# Patient Record
Sex: Male | Born: 1975 | Race: White | Hispanic: No | Marital: Single | Smoking: Former smoker
Health system: Southern US, Community
[De-identification: ages and names within clinical notes are randomized; demographics above are authoritative.]

## PROBLEM LIST (undated history)

## (undated) DIAGNOSIS — K219 Gastro-esophageal reflux disease without esophagitis: Secondary | ICD-10-CM

## (undated) DIAGNOSIS — G479 Sleep disorder, unspecified: Secondary | ICD-10-CM

## (undated) DIAGNOSIS — F319 Bipolar disorder, unspecified: Secondary | ICD-10-CM

## (undated) DIAGNOSIS — F909 Attention-deficit hyperactivity disorder, unspecified type: Secondary | ICD-10-CM

## (undated) DIAGNOSIS — E785 Hyperlipidemia, unspecified: Secondary | ICD-10-CM

## (undated) DIAGNOSIS — F4310 Post-traumatic stress disorder, unspecified: Secondary | ICD-10-CM

## (undated) DIAGNOSIS — M1990 Unspecified osteoarthritis, unspecified site: Secondary | ICD-10-CM

## (undated) DIAGNOSIS — M797 Fibromyalgia: Secondary | ICD-10-CM

## (undated) DIAGNOSIS — F209 Schizophrenia, unspecified: Secondary | ICD-10-CM

## (undated) DIAGNOSIS — Z87442 Personal history of urinary calculi: Secondary | ICD-10-CM

## (undated) DIAGNOSIS — N529 Male erectile dysfunction, unspecified: Secondary | ICD-10-CM

## (undated) DIAGNOSIS — I10 Essential (primary) hypertension: Secondary | ICD-10-CM

## (undated) DIAGNOSIS — J189 Pneumonia, unspecified organism: Secondary | ICD-10-CM

## (undated) DIAGNOSIS — G709 Myoneural disorder, unspecified: Secondary | ICD-10-CM

## (undated) DIAGNOSIS — J302 Other seasonal allergic rhinitis: Secondary | ICD-10-CM

## (undated) DIAGNOSIS — I2510 Atherosclerotic heart disease of native coronary artery without angina pectoris: Secondary | ICD-10-CM

## (undated) DIAGNOSIS — F32A Depression, unspecified: Secondary | ICD-10-CM

## (undated) HISTORY — PX: OTHER SURGICAL HISTORY: SHX169

## (undated) HISTORY — PX: REVISION AMPUTATION, BELOW THE KNEE: SHX7423

## (undated) HISTORY — PX: HAND SURGERY: SHX662

## (undated) HISTORY — PX: KNEE SURGERY: SHX244

---

## 1996-11-19 HISTORY — PX: KNEE SURGERY: SHX244

## 2002-01-15 ENCOUNTER — Emergency Department (HOSPITAL_COMMUNITY): Admission: EM | Admit: 2002-01-15 | Discharge: 2002-01-15 | Payer: Self-pay | Admitting: Emergency Medicine

## 2004-05-03 ENCOUNTER — Emergency Department (HOSPITAL_COMMUNITY): Admission: EM | Admit: 2004-05-03 | Discharge: 2004-05-04 | Payer: Self-pay

## 2010-01-31 ENCOUNTER — Emergency Department (HOSPITAL_COMMUNITY): Admission: EM | Admit: 2010-01-31 | Discharge: 2010-01-31 | Payer: Self-pay | Admitting: Emergency Medicine

## 2010-02-14 ENCOUNTER — Emergency Department (HOSPITAL_COMMUNITY): Admission: EM | Admit: 2010-02-14 | Discharge: 2010-02-14 | Payer: Self-pay | Admitting: Emergency Medicine

## 2010-09-11 ENCOUNTER — Emergency Department (HOSPITAL_COMMUNITY)
Admission: EM | Admit: 2010-09-11 | Discharge: 2010-09-11 | Payer: Self-pay | Source: Home / Self Care | Admitting: Emergency Medicine

## 2011-01-06 ENCOUNTER — Emergency Department (HOSPITAL_COMMUNITY)
Admission: EM | Admit: 2011-01-06 | Discharge: 2011-01-06 | Disposition: A | Discharge status: Home / Self Care | Payer: Self-pay | Attending: Emergency Medicine | Admitting: Emergency Medicine

## 2011-01-06 DIAGNOSIS — E119 Type 2 diabetes mellitus without complications: Secondary | ICD-10-CM | POA: Insufficient documentation

## 2011-01-06 DIAGNOSIS — T391X1A Poisoning by 4-Aminophenol derivatives, accidental (unintentional), initial encounter: Secondary | ICD-10-CM | POA: Insufficient documentation

## 2011-01-06 DIAGNOSIS — F3289 Other specified depressive episodes: Secondary | ICD-10-CM | POA: Insufficient documentation

## 2011-01-06 DIAGNOSIS — Z79899 Other long term (current) drug therapy: Secondary | ICD-10-CM | POA: Insufficient documentation

## 2011-01-06 DIAGNOSIS — R000 Tachycardia, unspecified: Secondary | ICD-10-CM | POA: Insufficient documentation

## 2011-01-06 DIAGNOSIS — F329 Major depressive disorder, single episode, unspecified: Secondary | ICD-10-CM | POA: Insufficient documentation

## 2011-01-06 DIAGNOSIS — F209 Schizophrenia, unspecified: Secondary | ICD-10-CM | POA: Insufficient documentation

## 2011-01-06 LAB — CBC
HCT: 48.9 % (ref 39.0–52.0)
MCHC: 34.2 g/dL (ref 30.0–36.0)
MCV: 90.7 fL (ref 78.0–100.0)
RDW: 13.2 % (ref 11.5–15.5)

## 2011-01-06 LAB — COMPREHENSIVE METABOLIC PANEL
Alkaline Phosphatase: 61 U/L (ref 39–117)
BUN: 7 mg/dL (ref 6–23)
Calcium: 9.7 mg/dL (ref 8.4–10.5)
Glucose, Bld: 200 mg/dL — ABNORMAL HIGH (ref 70–99)
Total Protein: 7.8 g/dL (ref 6.0–8.3)

## 2011-01-06 LAB — RAPID URINE DRUG SCREEN, HOSP PERFORMED
Amphetamines: NOT DETECTED
Cocaine: NOT DETECTED
Opiates: NOT DETECTED
Tetrahydrocannabinol: POSITIVE — AB

## 2011-01-06 LAB — PROTIME-INR: INR: 0.95 (ref 0.00–1.49)

## 2011-01-06 LAB — DIFFERENTIAL
Basophils Absolute: 0.1 10*3/uL (ref 0.0–0.1)
Eosinophils Relative: 5 % (ref 0–5)
Lymphocytes Relative: 26 % (ref 12–46)
Monocytes Absolute: 0.9 10*3/uL (ref 0.1–1.0)

## 2011-01-06 LAB — URINALYSIS, ROUTINE W REFLEX MICROSCOPIC
Bilirubin Urine: NEGATIVE
Hgb urine dipstick: NEGATIVE
Specific Gravity, Urine: 1.011 (ref 1.005–1.030)
pH: 6.5 (ref 5.0–8.0)

## 2011-01-06 LAB — SALICYLATE LEVEL: Salicylate Lvl: <4 mg/dL (ref 2.8–20.0)

## 2011-01-06 LAB — ACETAMINOPHEN LEVEL: Acetaminophen (Tylenol), Serum: 15.7 ug/mL (ref 10–30)

## 2011-01-25 NOTE — H&P (Signed)
NAME:  Mitchell George, Mitchell George NO.:  [id number]  MEDICAL RECORD NO.:  [id number]           PATIENT TYPE:  E  LOCATION:  WLED                         FACILITY:  [facility]  PHYSICIAN:  Lonia Blood, M.D.      DATE OF BIRTH:  01/20/1976  DATE OF ADMISSION:  01/06/2011 DATE OF DISCHARGE:                             HISTORY & PHYSICAL   PRIMARY CARE PHYSICIAN:  He is unassigned to [place].  PRESENTING COMPLAINT:  Tylox overdose.  HISTORY OF PRESENT ILLNESS:  The patient is a 35 year old gentleman with known history of bipolar disorder and schizophrenia, who apparently took about 19 tablets of Tylox 5/500 mg.  He did because the patient was angry and not happy with his girlfriend.  He also complained of left shoulder pain, that was excessive.  He currently denied any suicide ideation or suicide attempt.  He denied any previous attempts, although he is known to have bipolar disorder.  He was upset with his girlfriend apparently who will not answer the phone at that time.  In the ED, he was seen about 2 hours after the ingestion and his initial Tylenol level was only slightly elevated at 36.  The patient is currently refusing to stay in the hospital.  He denied any previous suicide attempts.  He is a known diabetic, but very noncompliant, not taking any medications consistently.  He has been taking metformin on and off otherwise. Denied any dizziness.  Denied any headaches.  No any evidence of bleeds.  PAST MEDICAL HISTORY:  Significant for, 1. Schizophrenia. 2. Bipolar disorder. 3. Type 2 diabetes. 4. Also history of seasonal allergies and polysubstance abuse.  ALLERGIES:  He has no known drug allergies, although he has BEES and POLLENS allergies.  CURRENT MEDICATIONS:  Trazodone, Vistaril, Tylox, and occasional metformin.  SOCIAL HISTORY:  The patient lives in [place], [place].  He is unemployed.  He smokes about 1 pack per day, occasionally drinks, also uses cannabis.   Denied any IV drug use.  FAMILY HISTORY:  Significant for MI in his family.  REVIEW OF SYSTEMS:  The patient complained of left shoulder pain. Otherwise, all systems reviewed are currently negative except per HPI.  PHYSICAL EXAMINATION:  VITAL SIGNS:  Temperature is 98.1, blood pressure 140/86 with a pulse of 71, respiratory rate 18, he is saturating 100% room air. GENERAL:  He is awake, alert, oriented, very combative, angry, pacing around the room.  He is in no acute distress. HEENT:  PERRL.  EOMI.  No pallor.  No jaundice.  No rhinorrhea. NECK:  Supple.  No visible JVD.  No lymphadenopathy. RESPIRATORY:  He has good air entry bilaterally.  No crackles.  No wheezes.  No rales. CARDIOVASCULAR SYSTEM:  He has S1 and S2.  No audible murmur. ABDOMEN:  Soft, full, nontender with positive bowel sounds. EXTREMITIES:  Showed no edema, cyanosis, or clubbing. SKIN:  No rashes.  No ulcers.  LABORATORY DATA:  His white count is 11.3 with normal differentials, hemoglobin 16.7, platelets 212.  PT is 12.9 with INR of 0.95, PTT of 36. Urinalysis shows mild glucosuria of 100.  Urine  drug screen is only positive for THC.  His initial acetaminophen level is 36.9, that is 2 hours post ingestion.  Sodium is 137, potassium 4.1, chloride 100, CO2 of 27, glucose 200, BUN 7, creatinine 0.75, total bilirubin 0.6, alkaline phosphatase 61, AST 22, ALT 20, total protein 7.8, albumin 4.5, calcium 9.7.  He salicylate level is less than 4.  ASSESSMENT:  This is a 35 year old gentleman presenting with Tylenol overdose.  The patient denied any suicidal ideation.  However, it appears he took these after he was angry with his girlfriend and everything points towards an attempted suicide at this point.  PLAN: 1. Tylenol overdose.  We will admit the patient.  Repeat his Tylenol     level at 6 hours interval 2 more times.  The patient has already     received Mucomyst in the ED as well as charcoal.  His LFTs are  not     elevated at this point.  We will, however, admit him for     observation to check the serial Tylenol levels as well as his LFTs     and make sure they will be returned to normal or at least there are     not elevated.  Once we clear him from our standpoint, we will let     Psych decide what to do. 2. Bipolar disorder and schizophrenia.  Again, the patient is taking     his medication.  I will continue the trazodone and we will get     psych consult in the morning for further management.  If the     patient insists on leaving at this point, we will involuntarily     commit him as the patient is definitely not a danger to himself. 3. Tobacco abuse.  I will give him some nicotine patch as well as     tobacco cessation counseling . 4. Polysubstance abuse.  Again, the patient will receive counseling     while in the hospital.  If Psychiatry decides to send him to     inpatient psych, again he will continue to get care over there.     Further treatment will depend on the patient's response to these     measures and also depend on what psychiatric says.     Lonia Blood, M.D.     Verlin Grills  D:  01/06/2011  T:  01/06/2011  Job:  [id number]  Electronically Signed by Lonia Blood M.D. on 01/24/2011 04:20:25 PM

## 2011-02-11 LAB — GLUCOSE, CAPILLARY: Glucose-Capillary: 324 mg/dL — ABNORMAL HIGH (ref 70–99)

## 2012-02-26 ENCOUNTER — Emergency Department (HOSPITAL_COMMUNITY): Payer: Self-pay

## 2012-02-26 ENCOUNTER — Encounter (HOSPITAL_COMMUNITY): Payer: Self-pay | Admitting: *Deleted

## 2012-02-26 ENCOUNTER — Emergency Department (HOSPITAL_COMMUNITY)
Admission: EM | Admit: 2012-02-26 | Discharge: 2012-02-26 | Disposition: A | Discharge status: Home / Self Care | Payer: Self-pay | Attending: Emergency Medicine | Admitting: Emergency Medicine

## 2012-02-26 DIAGNOSIS — IMO0002 Reserved for concepts with insufficient information to code with codable children: Secondary | ICD-10-CM | POA: Insufficient documentation

## 2012-02-26 DIAGNOSIS — M79609 Pain in unspecified limb: Secondary | ICD-10-CM | POA: Insufficient documentation

## 2012-02-26 DIAGNOSIS — M25559 Pain in unspecified hip: Secondary | ICD-10-CM | POA: Insufficient documentation

## 2012-02-26 DIAGNOSIS — F319 Bipolar disorder, unspecified: Secondary | ICD-10-CM | POA: Insufficient documentation

## 2012-02-26 DIAGNOSIS — Z79899 Other long term (current) drug therapy: Secondary | ICD-10-CM | POA: Insufficient documentation

## 2012-02-26 DIAGNOSIS — S43409A Unspecified sprain of unspecified shoulder joint, initial encounter: Secondary | ICD-10-CM

## 2012-02-26 DIAGNOSIS — Z8659 Personal history of other mental and behavioral disorders: Secondary | ICD-10-CM | POA: Insufficient documentation

## 2012-02-26 DIAGNOSIS — M25519 Pain in unspecified shoulder: Secondary | ICD-10-CM | POA: Insufficient documentation

## 2012-02-26 DIAGNOSIS — F17200 Nicotine dependence, unspecified, uncomplicated: Secondary | ICD-10-CM | POA: Insufficient documentation

## 2012-02-26 DIAGNOSIS — E119 Type 2 diabetes mellitus without complications: Secondary | ICD-10-CM | POA: Insufficient documentation

## 2012-02-26 DIAGNOSIS — W108XXA Fall (on) (from) other stairs and steps, initial encounter: Secondary | ICD-10-CM | POA: Insufficient documentation

## 2012-02-26 HISTORY — DX: Schizophrenia, unspecified: F20.9

## 2012-02-26 HISTORY — DX: Bipolar disorder, unspecified: F31.9

## 2012-02-26 MED ORDER — IBUPROFEN 800 MG PO TABS: 800.0000 mg | ORAL_TABLET | Freq: Three times a day (TID) | ORAL | Status: AC

## 2012-02-26 MED ORDER — OXYCODONE-ACETAMINOPHEN 5-325 MG PO TABS
1.0000 | ORAL_TABLET | Freq: Once | ORAL | Status: AC
  Administered 2012-02-26: 1 via ORAL
  Filled 2012-02-26: qty 1

## 2012-02-26 MED ORDER — IBUPROFEN 800 MG PO TABS
800.0000 mg | ORAL_TABLET | Freq: Once | ORAL | Status: AC
  Administered 2012-02-26: 800 mg via ORAL
  Filled 2012-02-26: qty 1

## 2012-02-26 MED ORDER — ACETAMINOPHEN-CODEINE #3 300-30 MG PO TABS: 1.0000 | ORAL_TABLET | Freq: Four times a day (QID) | ORAL | Status: AC | PRN

## 2012-02-26 NOTE — Discharge Instructions (Signed)
Take ibuprofen as directed for inflammation and pain with Tylenol #3 for breakthrough pain but do not drive or operate machinery with tylenol #3 use. Ice to areas of soreness for the next few days and then may move to heat. Expect to be sore for the next few day and follow up with your primary care physician for recheck of ongoing symptoms but return to ER for emergent changing or worsening of symptoms.    Hip Injury You have a been diagnosed with a hip injury. Falls can cause fractures to the pelvis and hip as well as very painful bruises. These are called 'hip pointers'. Muscle injuries, arthritis, sciatica, and bursitis and can also cause severe pelvic or hip pain. An x-ray exam will usually show a fractured bone, but sometimes other studies like a CT scan or MRI may be needed to be certain about the diagnosis. All painful hip injuries should be treated like there might be a fracture until they are better or determined not to be fractures. Rest in bed over the next 3-4 days, or as long as you have severe pain. You should not bear weight on your injured hip. Use crutches or a walker. Ice packs can be applied to the injury site for 20 minutes every 2-4 hours for several days. Pain medicine may be needed to help you rest, especially at night.  A follow-up exam and further studies to determine the cause of your pain are important if your symptoms do not improve rapidly over the next week.  SEEK IMMEDIATE MEDICAL CARE IF:  You re-injure your hip.   You develop more pain, a fever, inability to walk, or other problems.  MAKE SURE YOU:   Understand these instructions.   Will watch your condition.   Will get help right away if you are not doing well or get worse.  Document Released: 12/13/2004 Document Revised: 10/25/2011 Document Reviewed: 02/24/2009 [facility] Patient Information 2012 [place], [place].

## 2012-02-26 NOTE — ED Provider Notes (Signed)
History     CSN: [id number]  Arrival date & time 02/26/12  1614   First MD Initiated Contact with Patient 02/26/12 2219      Chief Complaint  Patient presents with  . Fall    (Consider location/radiation/quality/duration/timing/severity/associated sxs/prior treatment) HPI  Patient presents to the emergency department complaining of left shoulder and hip injury after he states he was walking off of his porch prior to arrival and tripped causing him to fall from the steps onto the ground stating he fell approximately 4 and a half feet. Patient states he landed on his left shoulder and his hip. Patient is complaining of left shoulder and hip pain. Patient denies hitting his head or loss of consciousness. Patient states he took 2 extra strength Tylenol prior to arrival with mild relief of pain. Patient states pain and hip and shoulder aggravated by movement and improved with sitting still. Patient states is followed by primary care physician's office in [place] but is unsure of the name. Patient states his diabetes management and psychiatric needs are taken care of at primary care provider's office. Patient denies extremity numbness/tingling/weakness, chest pain, abdominal pain, shortness of breath, extremity deformity, or difficulty ambulating. Patient states mild pain in hip with ambulating.  Past Medical History  Diagnosis Date  . Diabetes mellitus   . Bipolar 1 disorder   . Schizophrenia     Past Surgical History  Procedure Date  . Knee surgery   . Hand surgery     No family history on file.  History  Substance Use Topics  . Smoking status: Current Everyday Smoker  . Smokeless tobacco: Not on file  . Alcohol Use: Yes      Review of Systems  All other systems reviewed and are negative.    Allergies  Review of patient's allergies indicates no known allergies.  Home Medications   Current Outpatient Rx  Name Route Sig Dispense Refill  . BUPROPION HCL ER (SR) 150  MG PO TB12 Oral Take 150 mg by mouth daily.    Marland Kitchen CITALOPRAM HYDROBROMIDE 40 MG PO TABS Oral Take 40 mg by mouth daily.    Marland Kitchen GABAPENTIN 300 MG PO CAPS Oral Take 300 mg by mouth 3 (three) times daily.    Marland Kitchen GLIPIZIDE 5 MG PO TABS Oral Take 5 mg by mouth 2 (two) times daily before a meal.    . HYDROXYZINE PAMOATE 50 MG PO CAPS Oral Take 100 mg by mouth 4 (four) times daily as needed. As anxiety    . LITHIUM CARBONATE 300 MG PO CAPS Oral Take 300 mg by mouth 2 (two) times daily.    Marland Kitchen RANITIDINE HCL 150 MG PO TABS Oral Take 150 mg by mouth 2 (two) times daily.    Marland Kitchen SIMVASTATIN 20 MG PO TABS Oral Take 20 mg by mouth every evening.    . ACETAMINOPHEN-CODEINE #3 300-30 MG PO TABS Oral Take 1-2 tablets by mouth every 6 (six) hours as needed for pain. 15 tablet 0  . IBUPROFEN 800 MG PO TABS Oral Take 1 tablet (800 mg total) by mouth 3 (three) times daily. 15 tablet 0    BP 142/87  Pulse 68  Temp(Src) 97.7 F (36.5 C) (Oral)  Resp 20  Ht 6\' 1"  (1.854 m)  Wt 246 lb (111.585 kg)  BMI 32.46 kg/m2  SpO2 97%  Physical Exam  Nursing note and vitals reviewed. Constitutional: He is oriented to person, place, and time. He appears well-developed and well-nourished. No distress.  HENT:  Head: Normocephalic and atraumatic.  Eyes: Conjunctivae and EOM are normal. Pupils are equal, round, and reactive to light.  Neck: Normal range of motion. Neck supple.  Cardiovascular: Normal rate, regular rhythm, normal heart sounds and intact distal pulses.  Exam reveals no gallop and no friction rub.   No murmur heard. Pulmonary/Chest: Effort normal and breath sounds normal. No respiratory distress. He has no wheezes. He has no rales. He exhibits no tenderness.  Abdominal: Soft. Bowel sounds are normal. He exhibits no distension and no mass. There is no tenderness. There is no rebound and no guarding.  Musculoskeletal: Normal range of motion. He exhibits tenderness. He exhibits no edema.       FROM of bilateral UE adn  LUE with mild pain with FROM of left arm and left hip but no crepitous and 5/5 strength with movement. Good radial pulse and femoral pulse bialterally. No TTP of entire mid line spine.   Neurological: He is alert and oriented to person, place, and time.  Skin: Skin is warm and dry. No rash noted. He is not diaphoretic. No erythema.  Psychiatric: He has a normal mood and affect.    ED Course  Procedures (including critical care time)  PO percocet and ibuprofen.   Labs Reviewed - No data to display Dg Hip Complete Left  02/26/2012  *RADIOLOGY REPORT*  Clinical Data: Trauma, fall  LEFT HIP - COMPLETE 2+ VIEW  Comparison: Plain films 09/11/2010  Findings: Hips are located.  Dedicated views of the left hip demonstrates no fracture dislocation.  There is joint space narrowing and mild sclerosis of the acetabular roofs.  No change from prior. No evidence of pelvic fracture sacral fracture.  IMPRESSION: No acute findings in the pelvis.  No left hip fracture.  Original Report Authenticated By: Genevive Bi, M.D.   Dg Shoulder Left  02/26/2012  *RADIOLOGY REPORT*  Clinical Data: Fall from horse onto the left side.  LEFT SHOULDER - 2+ VIEW  Comparison: Shoulder films 09/11/2010  Findings: No evidence of acute fracture or dislocation of the left shoulder.  IMPRESSION: No acute fracture dislocation.  No change from prior.  Original Report Authenticated By: Genevive Bi, M.D.     1. Hip pain   2. Shoulder sprain       MDM  5 out of 5 strength of bilateral upper trimming these and lower extremities with mild pain in left shoulder and left hip no acute findings on x-ray. Patient ambulating without difficulty. No midline spinal tenderness to palpation and no signs or symptoms of cauda equina or central cord compression. Patient denies hitting his head or loss of consciousness. His abdomen is soft and nontender. No complaints of chest pain in chest wall is nontender. Upper extremities and lower  extremities are neurovascularly intact with normal pulses bilaterally. Normal sensation of entire upper and lower extremities.        Jenness Corner, [place] 02/26/12 2231

## 2012-02-26 NOTE — ED Notes (Signed)
Patient able to ambulate without difficulty.

## 2012-02-26 NOTE — ED Notes (Signed)
Patient given discharge paperwork; went over discharge instructions with patient.  Instructed patient to take Tylenol #3 and ibuprofen as directed, and to not drive while taking pain medication.  Instructed to follow up with primary care physician and to return to the ED for new, worsening, or concerning symptoms.

## 2012-02-26 NOTE — ED Notes (Signed)
Patient sates he fell off his porch on yesterday,  approx 4.5 feet.  He has pain in his shoulder, left hip, and left leg.  Patient denies loc.  He has increased pain with any movement

## 2012-02-26 NOTE — ED Notes (Signed)
Patient states that he fell off a porch while walking today (fell about four and a half feet) and fell on the left side of his body; patient complaining of left sided shoulder pain and left hip pain.  Patient rates pain 7/10 on the numerical pain scale; describes pain as "sore" and "aching".  Patient able to move all extremities without difficultly.  Patient does report a tingling sensation down left leg.  Patient seems upset about wait; informed patient that x-ray results are already in and that PA will be at bedside to talk about results.  Patient alert and oriented x4; PERRL present.  Will continue to monitor.

## 2012-02-27 NOTE — ED Provider Notes (Signed)
Medical screening examination/treatment/procedure(s) were performed by non-physician practitioner and as supervising physician I was immediately available for consultation/collaboration. Devoria Albe, MD, Armando Gang   Ward Givens, MD 02/27/12 [phone]

## 2013-06-10 ENCOUNTER — Encounter (HOSPITAL_COMMUNITY): Payer: Self-pay | Admitting: *Deleted

## 2013-06-10 ENCOUNTER — Emergency Department (HOSPITAL_COMMUNITY)
Admission: EM | Admit: 2013-06-10 | Discharge: 2013-06-10 | Disposition: A | Discharge status: Home / Self Care | Payer: Medicaid Other | Attending: Emergency Medicine | Admitting: Emergency Medicine

## 2013-06-10 ENCOUNTER — Emergency Department (HOSPITAL_COMMUNITY): Payer: Medicaid Other

## 2013-06-10 DIAGNOSIS — IMO0002 Reserved for concepts with insufficient information to code with codable children: Secondary | ICD-10-CM | POA: Insufficient documentation

## 2013-06-10 DIAGNOSIS — Y939 Activity, unspecified: Secondary | ICD-10-CM | POA: Insufficient documentation

## 2013-06-10 DIAGNOSIS — Y929 Unspecified place or not applicable: Secondary | ICD-10-CM | POA: Insufficient documentation

## 2013-06-10 DIAGNOSIS — M5417 Radiculopathy, lumbosacral region: Secondary | ICD-10-CM

## 2013-06-10 DIAGNOSIS — Z8659 Personal history of other mental and behavioral disorders: Secondary | ICD-10-CM | POA: Insufficient documentation

## 2013-06-10 DIAGNOSIS — E119 Type 2 diabetes mellitus without complications: Secondary | ICD-10-CM | POA: Insufficient documentation

## 2013-06-10 DIAGNOSIS — F319 Bipolar disorder, unspecified: Secondary | ICD-10-CM | POA: Insufficient documentation

## 2013-06-10 DIAGNOSIS — W108XXA Fall (on) (from) other stairs and steps, initial encounter: Secondary | ICD-10-CM | POA: Insufficient documentation

## 2013-06-10 DIAGNOSIS — F17200 Nicotine dependence, unspecified, uncomplicated: Secondary | ICD-10-CM | POA: Insufficient documentation

## 2013-06-10 DIAGNOSIS — Z91040 Latex allergy status: Secondary | ICD-10-CM | POA: Insufficient documentation

## 2013-06-10 MED ORDER — HYDROCODONE-ACETAMINOPHEN 5-325 MG PO TABS: 1.0000 | ORAL_TABLET | Freq: Four times a day (QID) | ORAL | Status: DC | PRN

## 2013-06-10 MED ORDER — OXYCODONE-ACETAMINOPHEN 5-325 MG PO TABS
1.0000 | ORAL_TABLET | Freq: Once | ORAL | Status: AC
  Administered 2013-06-10: 1 via ORAL
  Filled 2013-06-10: qty 1

## 2013-06-10 MED ORDER — NAPROXEN 375 MG PO TABS: 375.0000 mg | ORAL_TABLET | Freq: Two times a day (BID) | ORAL | Status: DC

## 2013-06-10 NOTE — ED Provider Notes (Signed)
History    CSN: [id number] Arrival date & time 06/10/13  1318  First MD Initiated Contact with Patient 06/10/13 1435     Chief Complaint  Patient presents with  . Fall  . Back Pain   (Consider location/radiation/quality/duration/timing/severity/associated sxs/prior Treatment) HPI Mitchell George is a 37 y.o. male who presents to ED with complaint of blower back pain. States a week ago he fell down few steps. States landed on lower back. Since then pain in the back radiating down bilateral thighs. States decreased sensation in bilateral thighs. Denies loss of urine or bowels. Denies weakness in extremities. No abdominal pain. No fever chills. No iv drug use. Did not take anything for pain.    Past Medical History  Diagnosis Date  . Diabetes mellitus   . Bipolar 1 disorder   . Schizophrenia    Past Surgical History  Procedure Laterality Date  . Knee surgery    . Hand surgery     No family history on file. History  Substance Use Topics  . Smoking status: Current Every Day Smoker  . Smokeless tobacco: Never Used  . Alcohol Use: Yes    Review of Systems  Constitutional: Negative for fever and chills.  HENT: Negative for neck pain.   Respiratory: Negative.   Cardiovascular: Negative.   Genitourinary: Negative for frequency and flank pain.  Musculoskeletal: Positive for back pain and arthralgias.  Skin: Negative.   Neurological: Positive for numbness. Negative for weakness.    Allergies  Latex  Home Medications   Current Outpatient Rx  Name  Route  Sig  Dispense  Refill  . gabapentin (NEURONTIN) 600 MG tablet   Oral   Take 600 mg by mouth 2 (two) times daily.         Marland Kitchen glipiZIDE (GLUCOTROL) 10 MG tablet   Oral   Take 10 mg by mouth 2 (two) times daily before a meal.         . hydrOXYzine (VISTARIL) 50 MG capsule   Oral   Take 100 mg by mouth 4 (four) times daily as needed for anxiety.          . metFORMIN (GLUCOPHAGE) 500 MG tablet   Oral   Take 500  mg by mouth.         . ranitidine (ZANTAC) 150 MG tablet   Oral   Take 150 mg by mouth 2 (two) times daily.         Marland Kitchen buPROPion (WELLBUTRIN SR) 150 MG 12 hr tablet   Oral   Take 150 mg by mouth daily.         . citalopram (CELEXA) 40 MG tablet   Oral   Take 40 mg by mouth daily.         Marland Kitchen HYDROcodone-acetaminophen (NORCO) 5-325 MG per tablet   Oral   Take 1 tablet by mouth every 6 (six) hours as needed.   20 tablet   0   . lithium carbonate 300 MG capsule   Oral   Take 300 mg by mouth 2 (two) times daily.         . naproxen (NAPROSYN) 375 MG tablet   Oral   Take 1 tablet (375 mg total) by mouth 2 (two) times daily.   20 tablet   0   . simvastatin (ZOCOR) 20 MG tablet   Oral   Take 20 mg by mouth every evening.          BP 129/71  Pulse 89  Temp(Src) 98.8 F (37.1 C) (Oral)  Resp 18  Ht 6\' 2"  (1.88 m)  Wt 202 lb (91.627 kg)  BMI 25.92 kg/m2  SpO2 99% Physical Exam  Nursing note and vitals reviewed. Constitutional: He appears well-developed and well-nourished. No distress.  Neck: Neck supple.  Cardiovascular: Normal rate, regular rhythm and normal heart sounds.   Pulmonary/Chest: Effort normal and breath sounds normal. No respiratory distress. He has no wheezes. He has no rales.  Musculoskeletal:  Midline and left SI joint tenderness. Pain with bilateral straight legs  Raise. DP pulses normal  Neurological:  5/5 and equal lower extremity strength. 2+ and equal patellar reflexes bilaterally. Pt able to dorsiflex bilateral toes and feet with good strength against resistance. Equal sensation bilaterally over thighs and lower legs.   Skin: Skin is warm and dry.    ED Course  Procedures (including critical care time) Labs Reviewed - No data to display Dg Lumbar Spine Complete  06/10/2013   *RADIOLOGY REPORT*  Clinical Data: Increasing low back pain extending into both legs. The patient fell last week.  LUMBAR SPINE - COMPLETE 4+ VIEW  Comparison:  None.  Findings: There are five lumbar type vertebral bodies.  The alignment is normal.  There is no evidence of acute fracture or pars defect.  Although the disc spaces are preserved, there is mild intervertebral spurring throughout the spine and mild facet disease inferiorly.  IMPRESSION: No acute osseous findings or malalignment.   Original Report Authenticated By: Carey Bullocks, M.D.   1. Lumbosacral radiculopathy     MDM  Pt with lower back pain, injury 1 wk ago. Pt neurovascularly intact. Normal reflexes. Normal sensation. No loss of bowels or urinary incontinence/retention. No recent falls or injuries. Pt ambulatory. Afebrile. No signs of cauda equina.  Will treat with ibuprofen, norco, follow up as needed.   Filed Vitals:   06/10/13 1335  BP: 129/71  Pulse: 89  Temp: 98.8 F (37.1 C)  TempSrc: Oral  Resp: 18  Height: 6\' 2"  (1.88 m)  Weight: 202 lb (91.627 kg)  SpO2: 99%     Reinaldo Helt A Lynnann Knudsen, PA-C 06/10/13 1626

## 2013-06-10 NOTE — ED Notes (Signed)
Pt states last Tuesday he fell down 9 stairs, states pain hasn't gotten any better, pt states having lower back pain radiating to thighs, states having some numbness in thighs also.

## 2013-06-10 NOTE — Progress Notes (Signed)
ED Cm noted pt with medicaid [place]. Pt confirmed pcp will be Dr Aida Puffer of [place] in "fifteen days" after being placed on he medicaid card Reports he visited DSS on 06/08/13 to have MD name entered as his pcp EPIC updated

## 2013-06-11 NOTE — ED Provider Notes (Signed)
Medical screening examination/treatment/procedure(s) were performed by non-physician practitioner and as supervising physician I was immediately available for consultation/collaboration.   Veronnica Hennings M Lucyann Romano, MD 06/11/13 1321 

## 2013-09-08 ENCOUNTER — Other Ambulatory Visit: Payer: Self-pay | Admitting: Family Medicine

## 2013-09-08 DIAGNOSIS — IMO0002 Reserved for concepts with insufficient information to code with codable children: Secondary | ICD-10-CM

## 2013-09-13 ENCOUNTER — Ambulatory Visit
Admission: RE | Admit: 2013-09-13 | Discharge: 2013-09-13 | Disposition: A | Discharge status: Home / Self Care | Payer: Medicaid Other | Source: Ambulatory Visit | Attending: Family Medicine | Admitting: Family Medicine

## 2013-09-13 DIAGNOSIS — IMO0002 Reserved for concepts with insufficient information to code with codable children: Secondary | ICD-10-CM

## 2013-09-21 ENCOUNTER — Other Ambulatory Visit: Payer: Self-pay | Admitting: Family Medicine

## 2013-09-21 DIAGNOSIS — M545 Low back pain, unspecified: Secondary | ICD-10-CM

## 2013-09-24 ENCOUNTER — Ambulatory Visit
Admission: RE | Admit: 2013-09-24 | Discharge: 2013-09-24 | Disposition: A | Discharge status: Home / Self Care | Payer: Medicaid Other | Source: Ambulatory Visit | Attending: Family Medicine | Admitting: Family Medicine

## 2013-09-24 DIAGNOSIS — M545 Low back pain: Secondary | ICD-10-CM

## 2013-09-25 ENCOUNTER — Ambulatory Visit
Admission: RE | Admit: 2013-09-25 | Discharge: 2013-09-25 | Disposition: A | Discharge status: Home / Self Care | Payer: Medicaid Other | Source: Ambulatory Visit | Attending: Family Medicine | Admitting: Family Medicine

## 2013-09-25 ENCOUNTER — Other Ambulatory Visit: Payer: Self-pay | Admitting: Family Medicine

## 2013-09-25 DIAGNOSIS — M545 Low back pain: Secondary | ICD-10-CM

## 2013-10-20 ENCOUNTER — Other Ambulatory Visit: Payer: Self-pay | Admitting: Family Medicine

## 2013-10-20 DIAGNOSIS — A5201 Syphilitic aneurysm of aorta: Secondary | ICD-10-CM

## 2013-10-21 ENCOUNTER — Ambulatory Visit
Admission: RE | Admit: 2013-10-21 | Discharge: 2013-10-21 | Disposition: A | Discharge status: Home / Self Care | Payer: Medicaid Other | Source: Ambulatory Visit | Attending: Family Medicine | Admitting: Family Medicine

## 2013-10-21 DIAGNOSIS — A5201 Syphilitic aneurysm of aorta: Secondary | ICD-10-CM

## 2013-10-21 MED ORDER — IOHEXOL 300 MG/ML  SOLN
30.0000 mL | Freq: Once | INTRAMUSCULAR | Status: AC | PRN
  Administered 2013-10-21: 30 mL via ORAL

## 2013-10-21 MED ORDER — IOHEXOL 300 MG/ML  SOLN
100.0000 mL | Freq: Once | INTRAMUSCULAR | Status: AC | PRN
  Administered 2013-10-21: 100 mL via INTRAVENOUS

## 2014-03-30 ENCOUNTER — Ambulatory Visit (INDEPENDENT_AMBULATORY_CARE_PROVIDER_SITE_OTHER): Payer: Medicaid Other | Admitting: Podiatrist

## 2014-03-30 ENCOUNTER — Encounter: Payer: Self-pay | Admitting: Podiatrist

## 2014-03-30 ENCOUNTER — Ambulatory Visit (INDEPENDENT_AMBULATORY_CARE_PROVIDER_SITE_OTHER): Payer: Medicaid Other

## 2014-03-30 VITALS — BP 119/77 | HR 56 | Resp 18

## 2014-03-30 DIAGNOSIS — M722 Plantar fascial fibromatosis: Secondary | ICD-10-CM

## 2014-03-30 DIAGNOSIS — M67979 Unspecified disorder of synovium and tendon, unspecified ankle and foot: Secondary | ICD-10-CM

## 2014-03-30 MED ORDER — TRIAMCINOLONE ACETONIDE 40 MG/ML IJ SUSP
20.0000 mg | Freq: Once | INTRAMUSCULAR | Status: AC
  Administered 2014-03-30: 20 mg

## 2014-03-30 MED ORDER — PREDNISONE 10 MG PO KIT
PACK | ORAL | Status: DC
Start: 2014-03-30 — End: 2014-05-14

## 2014-03-30 NOTE — Patient Instructions (Signed)

## 2014-03-30 NOTE — Progress Notes (Signed)
   Subjective:    Patient ID: Mitchell George, male    DOB: 04-06-1976, 38 y.o.   MRN: [id number]  HPI the back of my heel has been going on for more than a year and sore and tender and gets big by the end of the night and feels like a knife stabbing    Review of Systems  Musculoskeletal: Positive for back pain and gait problem.       Muscle and joint pain  All other systems reviewed and are negative.      Objective:   Physical Exam GENERAL APPEARANCE: Alert, conversant. Appropriately groomed. No acute distress.  VASCULAR: Pedal pulses palpable and strong bilateral.  Capillary refill time is immediate to all digits,  Proximal to distal cooling it warm to warm.  Digital hair growth is present bilateral  NEUROLOGIC: sensation is intact epicritically and protectively to 5.07 monofilament at 5/5 sites bilateral.  Light touch is intact bilateral, vibratory sensation intact bilateral, achilles tendon reflex is intact bilateral.  Negative Tinel sign is elicited MUSCULOSKELETAL: Pain on palpation plantar medial aspect left foot  at insertion of plantar fascia on the medial calcaneal tubercle. Inflammation at the insertion of the plantar fascia is present. Rectus foot type is seen. DERMATOLOGIC: skin color, texture, and turger are within normal limits.  Calluses plantar feet present but asymptomatic    Assessment & Plan:  Plantar  fasciitis left foot  Plan: Injected the left heel with Kenalog and Marcaine mixture under sterile technique and the patient tolerated it well. Dispensed a plantar fascial brace. Dispensed instructions for shoe gear changes and stretching. I will see him back in 3 weeks for followup. At that time we'll decide if an injection was beneficial or not.

## 2014-04-13 ENCOUNTER — Ambulatory Visit (INDEPENDENT_AMBULATORY_CARE_PROVIDER_SITE_OTHER): Payer: Medicaid Other

## 2014-04-13 ENCOUNTER — Ambulatory Visit (INDEPENDENT_AMBULATORY_CARE_PROVIDER_SITE_OTHER): Payer: Medicaid Other | Admitting: Podiatrist

## 2014-04-13 ENCOUNTER — Encounter: Payer: Self-pay | Admitting: Podiatrist

## 2014-04-13 VITALS — BP 139/87 | HR 71 | Resp 18

## 2014-04-13 DIAGNOSIS — M722 Plantar fascial fibromatosis: Secondary | ICD-10-CM

## 2014-04-13 DIAGNOSIS — T148XXA Other injury of unspecified body region, initial encounter: Secondary | ICD-10-CM

## 2014-04-13 MED ORDER — DICLOFENAC SODIUM 75 MG PO TBEC: 75.0000 mg | DELAYED_RELEASE_TABLET | Freq: Two times a day (BID) | ORAL | Status: DC

## 2014-04-13 NOTE — Patient Instructions (Addendum)
Stop taking naprosyn and start the Diclofenac Sodium instead-- it is a good antiinflammatory medication--  Wear a really supportive running style or hiking shoe to keep the feet well supported.  Call if you don't notice any improvement in 2 weeks and we will immobilize the foot in a boot.    Plantar Fasciitis (Heel Spur Syndrome) with Rehab The plantar fascia is a fibrous, ligament-like, soft-tissue structure that spans the bottom of the foot. Plantar fasciitis is a condition that causes pain in the foot due to inflammation of the tissue. SYMPTOMS   Pain and tenderness on the underneath side of the foot.  Pain that worsens with standing or walking. CAUSES  Plantar fasciitis is caused by irritation and injury to the plantar fascia on the underneath side of the foot. Common mechanisms of injury include:  Direct trauma to bottom of the foot.  Damage to a small nerve that runs under the foot where the main fascia attaches to the heel bone. Stress placed on the plantar fascia due to any mild increased activity or injury RISK INCREASES WITH:   Obesity.  Poor strength and flexibility.  Improperly fitted shoes.  Tight calf muscles.  Flat feet.  Failure to warm-up properly before activity.  PREVENTION  Warm up and stretch properly before activity.  Strength, flexibility  Maintain a health body weight.  Avoid stress on the plantar fascia.  Wear properly fitted shoes, including arch supports for individuals who have flat feet. PROGNOSIS  If treated properly, then the symptoms of plantar fasciitis usually resolve without surgery. However, occasionally surgery is necessary. RELATED COMPLICATIONS   Recurrent symptoms that may result in a chronic condition.  Problems of the lower back that are caused by compensating for the injury, such as limping.  Pain or weakness of the foot during push-off following surgery.  Chronic inflammation, scarring, and partial or complete fascia  tear, occurring more often from repeated injections. TREATMENT  Treatment initially involves the use of ice and medication to help reduce pain and inflammation. The use of strengthening and stretching exercises may help reduce pain with activity, especially stretches of the Achilles tendon.  Your caregiver may recommend that you use arch supports to help reduce stress on the plantar fascia. Often, corticosteroid injections are given to reduce inflammation. If symptoms persist for greater than 6 months despite non-surgical (conservative), then surgery may be recommended.  MEDICATION   If pain medication is necessary, then nonsteroidal anti-inflammatory medications, such as aspirin and ibuprofen, or other minor pain relievers, such as acetaminophen, are often recommended. Corticosteroid injections may be given by your caregiver.  HEAT AND COLD  Cold treatment (icing) relieves pain and reduces inflammation. Cold treatment should be applied for 10 to 15 minutes every 2 to 3 hours for inflammation and pain and immediately after any activity that aggravates your symptoms. Use ice packs or massage the area with a piece of ice (ice massage).  Heat treatment may be used prior to performing the stretching and strengthening activities prescribed by your caregiver, physical therapist, or athletic trainer. Use a heat pack or soak the injury in warm water. SEEK IMMEDIATE MEDICAL CARE IF:  Treatment seems to offer no benefit, or the condition worsens.  Any medications produce adverse side effects.    EXERCISES-- perform each exercise a total of 10-15 repetitions.  Hold for 30 seconds and perform 3 times per day   RANGE OF MOTION (ROM) AND STRETCHING EXERCISES - Plantar Fasciitis (Heel Spur Syndrome) These exercises may help you  when beginning to rehabilitate your injury.   While completing these exercises, remember:   Restoring tissue flexibility helps normal motion to return to the joints. This allows  healthier, less painful movement and activity.  An effective stretch should be held for at least 30 seconds.  A stretch should never be painful. You should only feel a gentle lengthening or release in the stretched tissue. RANGE OF MOTION - Toe Extension, Flexion  Sit with your right / left leg crossed over your opposite knee.  Grasp your toes and gently pull them back toward the top of your foot. You should feel a stretch on the bottom of your toes and/or foot.  Hold this stretch for __________ seconds.  Now, gently pull your toes toward the bottom of your foot. You should feel a stretch on the top of your toes and or foot.  Hold this stretch for __________ seconds. Repeat __________ times. Complete this stretch __________ times per day.  RANGE OF MOTION - Ankle Dorsiflexion, Active Assisted  Remove shoes and sit on a chair that is preferably not on a carpeted surface.  Place right / left foot under knee. Extend your opposite leg for support.  Keeping your heel down, slide your right / left foot back toward the chair until you feel a stretch at your ankle or calf. If you do not feel a stretch, slide your bottom forward to the edge of the chair, while still keeping your heel down.  Hold this stretch for __________ seconds. Repeat __________ times. Complete this stretch __________ times per day.  STRETCH  Gastroc, Standing  Place hands on wall.  Extend right / left leg, keeping the front knee somewhat bent.  Slightly point your toes inward on your back foot.  Keeping your right / left heel on the floor and your knee straight, shift your weight toward the wall, not allowing your back to arch.  You should feel a gentle stretch in the right / left calf. Hold this position for __________ seconds. Repeat __________ times. Complete this stretch __________ times per day. STRETCH  Soleus, Standing  Place hands on wall.  Extend right / left leg, keeping the other knee somewhat  bent.  Slightly point your toes inward on your back foot.  Keep your right / left heel on the floor, bend your back knee, and slightly shift your weight over the back leg so that you feel a gentle stretch deep in your back calf.  Hold this position for __________ seconds. Repeat __________ times. Complete this stretch __________ times per day. STRETCH  Gastrocsoleus, Standing  Note: This exercise can place a lot of stress on your foot and ankle. Please complete this exercise only if specifically instructed by your caregiver.   Place the ball of your right / left foot on a step, keeping your other foot firmly on the same step.  Hold on to the wall or a rail for balance.  Slowly lift your other foot, allowing your body weight to press your heel down over the edge of the step.  You should feel a stretch in your right / left calf.  Hold this position for __________ seconds.  Repeat this exercise with a slight bend in your right / left knee. Repeat __________ times. Complete this stretch __________ times per day.  STRENGTHENING EXERCISES - Plantar Fasciitis (Heel Spur Syndrome)  These exercises may help you when beginning to rehabilitate your injury. They may resolve your symptoms with or without further involvement from  your physician, physical therapist or athletic trainer. While completing these exercises, remember:   Muscles can gain both the endurance and the strength needed for everyday activities through controlled exercises.  Complete these exercises as instructed by your physician, physical therapist or athletic trainer. Progress the resistance and repetitions only as guided.

## 2014-04-13 NOTE — Progress Notes (Signed)
The left heel is doing ok and the brace did help and the shot made it feel worse and I notice on my left if I roll it a certain way that is when I feel it and the right foot I stepped on a tree limb and now this foot hurts some and I did it three days ago  Patient relates continued pain in the left heel.  He states the last visit I gave him an injection and the heel hurt for the rest of the day and for 3 days after where he had to use a cane.  He also took the prednisone taper kit which was not helpful either.  He also states he was walking on his property and stepped on a stick straight down on his right heel.  He relates it became bruised and the pain is still there.  Objective: Continued plantar fasciitis symptomatology is elicited plantar medial aspect of the left heel. Pain on the central plantar aspect of the right heel is also noted. X-rays of the right foot show a large inferior calcaneal spur which appears to be fractured in the central portion of the spur.  Assessment: Plantar fasciitis left, fractured inferior heel spur with bone bruise right  Than: I recommended a plantar fascial taping for the left foot. I also recommended a stronger anti-inflammatory medication and diclofenac 75 mg twice a day was written for him. I discussed that I normally would consider another injection however if it hurts him that bad we will not do that today. I also discussed it is not improved within 2 weeks he will call and I will prescribe him a offloading boot for the left foot. However he will be placing more pressure on the right foot where he are he has a deep bone bruise and at this time I don't want to cause more injury to this area. We'll see him back in 3 weeks for followup and for continued care.

## 2014-05-04 ENCOUNTER — Ambulatory Visit: Payer: Medicaid Other | Admitting: Podiatrist

## 2014-05-14 ENCOUNTER — Encounter (HOSPITAL_COMMUNITY): Payer: Self-pay | Admitting: Emergency Medicine

## 2014-05-14 ENCOUNTER — Emergency Department (HOSPITAL_COMMUNITY)
Admission: EM | Admit: 2014-05-14 | Discharge: 2014-05-14 | Disposition: A | Discharge status: Home / Self Care | Payer: Medicaid Other | Attending: Emergency Medicine | Admitting: Emergency Medicine

## 2014-05-14 ENCOUNTER — Emergency Department (HOSPITAL_COMMUNITY): Payer: Medicaid Other

## 2014-05-14 DIAGNOSIS — Y9301 Activity, walking, marching and hiking: Secondary | ICD-10-CM | POA: Insufficient documentation

## 2014-05-14 DIAGNOSIS — F17200 Nicotine dependence, unspecified, uncomplicated: Secondary | ICD-10-CM | POA: Insufficient documentation

## 2014-05-14 DIAGNOSIS — Z91040 Latex allergy status: Secondary | ICD-10-CM | POA: Insufficient documentation

## 2014-05-14 DIAGNOSIS — M79672 Pain in left foot: Secondary | ICD-10-CM

## 2014-05-14 DIAGNOSIS — F209 Schizophrenia, unspecified: Secondary | ICD-10-CM | POA: Insufficient documentation

## 2014-05-14 DIAGNOSIS — F319 Bipolar disorder, unspecified: Secondary | ICD-10-CM | POA: Insufficient documentation

## 2014-05-14 DIAGNOSIS — M791 Myalgia, unspecified site: Secondary | ICD-10-CM

## 2014-05-14 DIAGNOSIS — E119 Type 2 diabetes mellitus without complications: Secondary | ICD-10-CM | POA: Insufficient documentation

## 2014-05-14 DIAGNOSIS — S93609A Unspecified sprain of unspecified foot, initial encounter: Secondary | ICD-10-CM | POA: Insufficient documentation

## 2014-05-14 DIAGNOSIS — M722 Plantar fascial fibromatosis: Secondary | ICD-10-CM | POA: Insufficient documentation

## 2014-05-14 DIAGNOSIS — Z9889 Other specified postprocedural states: Secondary | ICD-10-CM | POA: Insufficient documentation

## 2014-05-14 DIAGNOSIS — Z79899 Other long term (current) drug therapy: Secondary | ICD-10-CM | POA: Insufficient documentation

## 2014-05-14 DIAGNOSIS — G8929 Other chronic pain: Secondary | ICD-10-CM | POA: Insufficient documentation

## 2014-05-14 DIAGNOSIS — M25572 Pain in left ankle and joints of left foot: Secondary | ICD-10-CM

## 2014-05-14 DIAGNOSIS — Z791 Long term (current) use of non-steroidal anti-inflammatories (NSAID): Secondary | ICD-10-CM | POA: Insufficient documentation

## 2014-05-14 DIAGNOSIS — X500XXA Overexertion from strenuous movement or load, initial encounter: Secondary | ICD-10-CM | POA: Insufficient documentation

## 2014-05-14 DIAGNOSIS — S93602A Unspecified sprain of left foot, initial encounter: Secondary | ICD-10-CM

## 2014-05-14 DIAGNOSIS — Y9289 Other specified places as the place of occurrence of the external cause: Secondary | ICD-10-CM | POA: Insufficient documentation

## 2014-05-14 MED ORDER — HYDROCODONE-ACETAMINOPHEN 5-325 MG PO TABS: 1.0000 | ORAL_TABLET | Freq: Four times a day (QID) | ORAL | Status: DC | PRN

## 2014-05-14 MED ORDER — HYDROCODONE-ACETAMINOPHEN 5-325 MG PO TABS
1.0000 | ORAL_TABLET | Freq: Once | ORAL | Status: AC
Start: 2014-05-14 — End: 2014-05-14
  Administered 2014-05-14: 1 via ORAL
  Filled 2014-05-14: qty 1

## 2014-05-14 NOTE — ED Notes (Signed)
Reports hx of foot pain and has been to podiatrist. Was walking up a hill today, went to step down on left foot and felt a pop and now having pain and swelling.

## 2014-05-14 NOTE — Discharge Instructions (Signed)
Please call your doctor for a followup appointment within 24-48 hours. When you talk to your doctor please let them know that you were seen in the emergency department and have them acquire all of your records so that they can discuss the findings with you and formulate a treatment plan to fully care for your new and ongoing problems. Please call and set up an appointment with your primary care provider, orthopedic, foot center Please keep foot and Cam Walker boot at all times Please use crutches-please to not bear weight for the next couple of days Please take medications as prescribed-while on pain medications there is to be no drinking alcohol, driving, operating any heavy machinery if there is extra please dispose in a proper manner. Please do not take any extra Tylenol for this can lead to Tylenol overdose and liver issues. Please rest, ice, elevate-toes above her nose. Please do not perform any strenuous physical activity Please continue to monitor symptoms closely if symptoms are to worsen or change (fever greater than 101, chills, chest pain, shortness of breath, difficulty breathing, numbness, tingling, worsening or changes to pain pattern, loss of sensation, changes to skin color, redness, hot to the touch, red streaks, fall,) please report back to the ED immediately  Foot Sprain The muscles and cord like structures which attach muscle to bone (tendons) that surround the feet are made up of units. A foot sprain can occur at the weakest spot in any of these units. This condition is most often caused by injury to or overuse of the foot, as from playing contact sports, or aggravating a previous injury, or from poor conditioning, or obesity. SYMPTOMS  Pain with movement of the foot.  Tenderness and swelling at the injury site.  Loss of strength is present in moderate or severe sprains. THE THREE GRADES OR SEVERITY OF FOOT SPRAIN ARE:  Mild (Grade I): Slightly pulled muscle without tearing  of muscle or tendon fibers or loss of strength.  Moderate (Grade II): Tearing of fibers in a muscle, tendon, or at the attachment to bone, with small decrease in strength.  Severe (Grade III): Rupture of the muscle-tendon-bone attachment, with separation of fibers. Severe sprain requires surgical repair. Often repeating (chronic) sprains are caused by overuse. Sudden (acute) sprains are caused by direct injury or over-use. DIAGNOSIS  Diagnosis of this condition is usually by your own observation. If problems continue, a caregiver may be required for further evaluation and treatment. X-rays may be required to make sure there are not breaks in the bones (fractures) present. Continued problems may require physical therapy for treatment. PREVENTION  Use strength and conditioning exercises appropriate for your sport.  Warm up properly prior to working out.  Use athletic shoes that are made for the sport you are participating in.  Allow adequate time for healing. Early return to activities makes repeat injury more likely, and can lead to an unstable arthritic foot that can result in prolonged disability. Mild sprains generally heal in 3 to 10 days, with moderate and severe sprains taking 2 to 10 weeks. Your caregiver can help you determine the proper time required for healing. HOME CARE INSTRUCTIONS   Apply ice to the injury for 15-20 minutes, 03-04 times per day. Put the ice in a plastic bag and place a towel between the bag of ice and your skin.  An elastic wrap (like an Ace bandage) may be used to keep swelling down.  Keep foot above the level of the heart, or at  least raised on a footstool, when swelling and pain are present.  Try to avoid use other than gentle range of motion while the foot is painful. Do not resume use until instructed by your caregiver. Then begin use gradually, not increasing use to the point of pain. If pain does develop, decrease use and continue the above measures,  gradually increasing activities that do not cause discomfort, until you gradually achieve normal use.  Use crutches if and as instructed, and for the length of time instructed.  Keep injured foot and ankle wrapped between treatments.  Massage foot and ankle for comfort and to keep swelling down. Massage from the toes up towards the knee.  Only take over-the-counter or prescription medicines for pain, discomfort, or fever as directed by your caregiver. SEEK IMMEDIATE MEDICAL CARE IF:   Your pain and swelling increase, or pain is not controlled with medications.  You have loss of feeling in your foot or your foot turns cold or blue.  You develop new, unexplained symptoms, or an increase of the symptoms that brought you to your caregiver. MAKE SURE YOU:   Understand these instructions.  Will watch your condition.  Will get help right away if you are not doing well or get worse. Document Released: 04/27/2002 Document Revised: 01/28/2012 Document Reviewed: 06/24/2008 [facility] Patient Information 2015 [place], [place]. This information is not intended to replace advice given to you by your health care provider. Make sure you discuss any questions you have with your health care provider.

## 2014-05-14 NOTE — Progress Notes (Signed)
Orthopedic Tech Progress Note Patient Details:  Mitchell George 11/07/1976 [id number]  Ortho Devices Type of Ortho Device: CAM walker;Crutches Ortho Device/Splint Location: lle Ortho Device/Splint Interventions: Application   Mitchell George, Mitchell George 05/14/2014, 6:31 PM

## 2014-05-14 NOTE — ED Notes (Signed)
Pt has hx of plantar fascitis. Pt was walking up a hill and felt his L posterior heel and ankle pop. Heel and ankle are red with small swollen area noted. Pt states it hurts to barely touch.

## 2014-05-14 NOTE — ED Provider Notes (Signed)
CSN: [id number]     Arrival date & time 05/14/14  1514 History   First MD Initiated Contact with Patient 05/14/14 1606   This chart was scribed for non-physician practitioner Jamse Mead, PA-C working with Richarda Blade, MD by Anastasia Pall, ED scribe. This patient was seen in room TR11C/TR11C and the patient's care was started at 5:47 PM.    Chief Complaint  Patient presents with  . Foot Injury   The history is provided by the patient. No language interpreter was used.   HPI Comments: Mitchell George is a 38 y.o. male with PMhx of DM, Bipolar disorder, Schizophrenia who presents to the Emergency Department complaining of constant, severe, stabbing,posterior aspect left foot pain, onset today, after walking up a hill, stepped down, and heard a "pop."   Pt reports that left foot pain radiates up the left leg.  He states that he is unable flex or extend his left foot secondary to pain. Pt states that he came straight to the ED after the incident. Pt reports history of plantar fasciatis, and bone spurs in left and right heels. 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 Pt reports associated swelling, and tingling in three toes. Pt also reports having knee surgery in 1998. Denied loss of sensation, numbness.  Past Medical History  Diagnosis Date  . Diabetes mellitus   . Bipolar 1 disorder   . Schizophrenia    Past Surgical History  Procedure Laterality Date  . Knee surgery    . Hand surgery     History reviewed. No pertinent family history. History  Substance Use Topics  . Smoking status: Current Every Day Smoker  . Smokeless tobacco: Never Used  . Alcohol Use: Yes    Review of Systems  Musculoskeletal: Positive for arthralgias (left foot) and joint swelling.  Skin: Negative for wound.  Neurological: Negative for syncope and numbness.  All other systems reviewed and are  negative.     Allergies  Latex  Home Medications   Prior to Admission medications   Medication Sig Start Date End Date Taking? Authorizing Provider  atorvastatin (LIPITOR) 10 MG tablet Take 10 mg by mouth daily.   Yes Historical Provider, MD  buPROPion (WELLBUTRIN SR) 150 MG 12 hr tablet Take 150 mg by mouth daily.   Yes Historical Provider, MD  citalopram (CELEXA) 40 MG tablet Take 40 mg by mouth daily.   Yes Historical Provider, MD  diclofenac (VOLTAREN) 75 MG EC tablet Take 1 tablet (75 mg total) by mouth 2 (two) times daily. 04/13/14  Yes Trudie Buckler, DPM  gabapentin (NEURONTIN) 600 MG tablet Take 600 mg by mouth 2 (two) times daily.   Yes Historical Provider, MD  glipiZIDE (GLUCOTROL) 10 MG tablet Take 10 mg by mouth 2 (two) times daily before a meal.   Yes Historical Provider, MD  hydrOXYzine (VISTARIL) 50 MG capsule Take 100 mg by mouth 4 (four) times daily as needed for anxiety.    Yes Historical Provider, MD  lithium carbonate 300 MG capsule Take 600 mg by mouth 3 (three) times daily.    Yes Historical Provider, MD  metFORMIN (GLUCOPHAGE) 500 MG tablet Take 500 mg by mouth.   Yes Historical Provider, MD  oxycodone-acetaminophen (LYNOX) 10-300 MG per tablet Take 1 tablet by mouth every 4 (four) hours as needed for pain. Patient states that he only takes the 10 mg due  to the 325 and made me have cramps in stomach/lc   Yes Historical Provider, MD  pantoprazole (PROTONIX) 40 MG tablet Take 40 mg by mouth daily.   Yes Historical Provider, MD  HYDROcodone-acetaminophen (NORCO/VICODIN) 5-325 MG per tablet Take 1 tablet by mouth every 6 (six) hours as needed for moderate pain or severe pain. 05/14/14   Rose-Marie Hickling, PA-C  PredniSONE 10 MG KIT 6 day tapering dose completed over a month ago from today 05-14-14 03/30/14   Trudie Buckler, DPM   BP 140/84  Pulse 76  Temp(Src) 98.6 F (37 C) (Oral)  Resp 18  Ht 6' 1"  (1.854 m)  Wt 196 lb (88.905 kg)  BMI 25.86 kg/m2  SpO2  98% Physical Exam  Nursing note and vitals reviewed. Constitutional: He is oriented to person, place, and time. He appears well-developed and well-nourished. No distress.  HENT:  Head: Normocephalic and atraumatic.  Eyes: Conjunctivae and EOM are normal. Pupils are equal, round, and reactive to light. Right eye exhibits no discharge. Left eye exhibits no discharge.  Neck: Normal range of motion. Neck supple.  Cardiovascular: Normal rate, regular rhythm and normal heart sounds.  Exam reveals no friction rub.   No murmur heard. Pulses:      Radial pulses are 2+ on the right side, and 2+ on the left side.       Dorsalis pedis pulses are 2+ on the right side, and 2+ on the left side.       Posterior tibial pulses are 2+ on the right side, and 2+ on the left side.  Cap refill less than 3 seconds Negative swelling or pitting edema identified to lower extremities Negative cyanosis Negative for ischemia Skin warm to the touch  Pulmonary/Chest: Effort normal and breath sounds normal. No respiratory distress. He has no wheezes. He has no rales.  Musculoskeletal: He exhibits tenderness.       Legs: Negative swelling, erythema, inflammation, lesions, sores, deformities identified to the left ankle and left foot. Negative ecchymosis. Negative malalignment identified. Discomfort upon palpation to the calcaneal region of the left foot and posterior aspect left gastrocnemius. Negative Thompson sign. Discomfort upon palpation to the Achilles tendon insertion. Full range of motion to the digits the left foot. Decreased range of motion to left ankle secondary to pain.  Neurological: He is alert and oriented to person, place, and time. No cranial nerve deficit. He exhibits normal muscle tone. Coordination normal.  Cranial nerves III-XII grossly intact Strength 4+/5+ to left lower extremity secondary to pain  Sensation intact with differentiation to sharp and dull touch Patient unable to bare weight to left  foot secondary to pain  Skin: Skin is warm and dry. No rash noted. He is not diaphoretic. No erythema.  Psychiatric: He has a normal mood and affect. His behavior is normal. Thought content normal.    ED Course  Procedures (including critical care time) DIAGNOSTIC STUDIES: Oxygen Saturation is 98% on RA, normal by my interpretation.    COORDINATION OF CARE: 4:55 PM-Discussed treatment plan with pt at bedside and pt agreed to plan.    Dg Ankle Complete Left  05/14/2014   CLINICAL DATA:  Pain post twisted foot, fall  EXAM: LEFT ANKLE COMPLETE - 3+ VIEW  COMPARISON:  None.  FINDINGS: Three views of left ankle submitted. No acute fracture or subluxation. Plantar and posterior spurring of calcaneus. Ankle mortise is preserved.  IMPRESSION: No acute fracture or subluxation. Plantar and posterior spurring of calcaneus.   Electronically Signed  By: Lahoma Crocker M.D.   On: 05/14/2014 17:00   Dg Foot Complete Left  05/14/2014   CLINICAL DATA:  Pain post twisted foot, fall  EXAM: LEFT FOOT - COMPLETE 3+ VIEW  COMPARISON:  None.  FINDINGS: Three views of the left foot submitted. No acute fracture or subluxation. There is plantar and posterior spurring of calcaneus. Spurring at the base of fifth metatarsal. No radiopaque foreign body. Mild dorsal spurring of navicular.  IMPRESSION: No acute fracture or subluxation. Plantar and posterior spurring of calcaneus. Mild spurring at the base of fifth metatarsal.   Electronically Signed   By: Lahoma Crocker M.D.   On: 05/14/2014 16:58        EKG Interpretation None      MDM   Final diagnoses:  Pain, joint, ankle and foot, left  Myalgia  Chronic foot pain, left  Plantar fasciitis  Foot sprain, left, initial encounter   Medications  HYDROcodone-acetaminophen (NORCO/VICODIN) 5-325 MG per tablet 1 tablet (1 tablet Oral Given 05/14/14 1734)   Filed Vitals:   05/14/14 1527  BP: 140/84  Pulse: 76  Temp: 98.6 F (37 C)  TempSrc: Oral  Resp: 18  Height:  6' 1"  (1.854 m)  Weight: 196 lb (88.905 kg)  SpO2: 98%   I personally performed the services described in this documentation, which was scribed in my presence. The recorded information has been reviewed and is accurate.  This provider reviewed patient's chart. 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 Plain film of left foot negative for acute fracture or subluxation-plantar and posterior spurring of the calcaneus identified, mild spurring at the base of the fifth metatarsal. Plain film of left ankle negative for acute osseous injury or subluxation. Negative focal neurological deficits noted. Sensation intact. Pulses palpable and strong DP and PT bilaterally. Warmth upon palpation to the skin-negative cyanosis or ischemia identified. Strength minimally decreased to the digits of the left foot secondary to pain. Decreased range of motion to left ankle secondary to pain. Discomfort upon palpation to the left gastrocnemius with negative bulging or knot identified. Negative Thompson sign. Discussed case with attending physician, Dr. Eulis Foster who recommended patient to be placed in an Ace bandage and crutches with followup and orthopedics. Doubt ischemia. Doubt compartment syndrome. Suspicion to be possible partial ligamental injury cannot be ruled out. Patient placed in cam walker boot and crutches administered. Patient stable, afebrile. Patient not septic appearing. Discharged patient. Discharged patient with small dose of pain medications-discussed course, precautions, disposal technique. Discussed with patient to rest, ice, elevate. Referred to PCP and orthopedics. Discussed with patient to closely monitor symptoms and if symptoms are to worsen or change to report back to the ED - strict return instructions given.  Patient agreed to plan of care, understood, all questions answered.   Jamse Mead, PA-C 05/15/14 1221

## 2014-05-15 NOTE — ED Provider Notes (Signed)
Medical screening examination/treatment/procedure(s) were performed by non-physician practitioner and as supervising physician I was immediately available for consultation/collaboration.  Flint MelterElliott L Siah Kannan, MD 05/15/14 1537

## 2014-05-25 ENCOUNTER — Ambulatory Visit (INDEPENDENT_AMBULATORY_CARE_PROVIDER_SITE_OTHER): Payer: Medicaid Other | Admitting: Podiatrist

## 2014-05-25 ENCOUNTER — Encounter: Payer: Self-pay | Admitting: Podiatrist

## 2014-05-25 VITALS — BP 156/90 | HR 69 | Resp 12

## 2014-05-25 DIAGNOSIS — M7660 Achilles tendinitis, unspecified leg: Secondary | ICD-10-CM

## 2014-05-25 DIAGNOSIS — M67972 Unspecified disorder of synovium and tendon, left ankle and foot: Secondary | ICD-10-CM

## 2014-05-25 NOTE — Patient Instructions (Signed)

## 2014-05-25 NOTE — Progress Notes (Signed)
   Subjective: Patient presents today for pain posterior aspect of the left heel at the Achilles tendon insertion. He states that he fell running after his child and felt a pop. [facility] were taken and nothing was broken. He was put in air fracture walker. He states he still has pain on the back of the heel. He's been wearing his plantar fascial brace on the right foot states it has been helping some.  Objective: Neurovascular status is intact. Pain on palpation at the insertion of the Achilles on the posterior heel is noted. No obvious deficit is palpated. Patient does have significant pain with range of motion. No ecchymosis is noted. Moderate swelling is seen.  Assessment: Partial tear Achilles tendon left  Plan: Recommended that he stay in the air fracture walker. Also discussed an MRI to assess the Achilles tendon to see if it's torn. We will order an MRI. He will continue wearing the boot until I see him back with the results of the MRI.

## 2014-06-09 ENCOUNTER — Telehealth: Payer: Self-pay | Admitting: *Deleted

## 2014-06-09 NOTE — Telephone Encounter (Signed)
Told patient needs to stay off as much as possible, keeping his foot elevated and icing 20 min and hour.  Told him to continue his antiinflammatory he states he just ran out put has called in a refill.  We are waiting to hear from Medicaid on the request for an MRI, I gave additional documentation today (06/09/14) and said they should have an answer for us in 72 hours.  I made patient aware of all this information.

## 2014-06-15 ENCOUNTER — Encounter: Payer: Self-pay | Admitting: Podiatrist

## 2014-06-15 ENCOUNTER — Ambulatory Visit (INDEPENDENT_AMBULATORY_CARE_PROVIDER_SITE_OTHER): Payer: Medicaid Other | Admitting: Podiatrist

## 2014-06-15 VITALS — BP 112/76 | HR 75 | Resp 18

## 2014-06-15 DIAGNOSIS — M719 Bursopathy, unspecified: Secondary | ICD-10-CM

## 2014-06-15 DIAGNOSIS — M67972 Unspecified disorder of synovium and tendon, left ankle and foot: Secondary | ICD-10-CM

## 2014-06-15 DIAGNOSIS — M6790 Unspecified disorder of synovium and tendon, unspecified site: Secondary | ICD-10-CM

## 2014-06-15 DIAGNOSIS — M722 Plantar fascial fibromatosis: Secondary | ICD-10-CM

## 2014-06-15 MED ORDER — TRIAMCINOLONE ACETONIDE 10 MG/ML IJ SUSP
10.0000 mg | Freq: Once | INTRAMUSCULAR | Status: AC
  Administered 2014-06-15: 10 mg

## 2014-06-15 NOTE — Progress Notes (Signed)
Subjective: Patient presents today for followup of pain posterior heel and Achilles tendon left as well as plantar fasciitis right. He states he's been using the crutches and staying in the boot and still has significant pain and now nerve type discomfort along the back of the heel-left. He's been favoring the right foot and his heel pain has gotten worse. He has been using his crutches as instructed.  Objective: Swelling is present on the posterior aspect of the left heel. There is an area of discrete deficit or he feels that the Achilles tendon is probably torn. Also he can barely plantarflex the left heel and has significant difficulty standing and walking. Concern over a torn Achilles tendon is present. Pain on the plantar medial aspect of the right heel is noted consistent with plantar fasciitis symptomatology.  Assessment: Torn Achilles tendon left, plantar fasciitis right  Plan: We are waiting on approval for Medicaid to get an MRI on the left Achilles tendon to determine How much of the tendon is torn. This would be planning for surgery if the tendon is significantly torn. I injected the right heel plantarly with Kenalog and Marcaine mix. He will stay in his boot on the left and continue using his crutches. We did discuss a rolling knee scooter to try and help offload the right foot. We will get the MRI and will call with the results.

## 2014-06-15 NOTE — Patient Instructions (Signed)
Continue wearing your boot and staying off of your foot. You may want to consider a rolling knee scooter. These are available at most medical supply stores

## 2014-06-28 ENCOUNTER — Telehealth: Payer: Self-pay | Admitting: *Deleted

## 2014-06-28 NOTE — Telephone Encounter (Signed)
I GOT THE PATIENT'S MESSAGE AND WILL CALL HIM TOMORROW. LISA

## 2014-07-01 NOTE — Telephone Encounter (Signed)
Patient called and wanted the results of the MRI and I stated what Dr Irving ShowsEgerton had stated and relied message to him and to call the office if any concerns. LIsa

## 2014-07-20 ENCOUNTER — Encounter: Payer: Self-pay | Admitting: Podiatrist

## 2014-07-20 ENCOUNTER — Ambulatory Visit (INDEPENDENT_AMBULATORY_CARE_PROVIDER_SITE_OTHER): Payer: Medicaid Other | Admitting: Podiatrist

## 2014-07-20 VITALS — BP 132/90 | HR 76 | Resp 12

## 2014-07-20 DIAGNOSIS — M722 Plantar fascial fibromatosis: Secondary | ICD-10-CM

## 2014-07-20 MED ORDER — TRIAMCINOLONE ACETONIDE 10 MG/ML IJ SUSP
10.0000 mg | Freq: Once | INTRAMUSCULAR | Status: AC
  Administered 2014-07-20: 10 mg

## 2014-07-20 NOTE — Progress Notes (Signed)
Subjective: Patient presents today for followup of pain posterior heel and Achilles tendon left as well as plantar fasciitis right. He states he's been using the crutches and staying in the boot and states the pain has finally started to get better.  He relates when he tries to go without the boot he hears and feels popping and pain in the posterior left heel.  As a result of the left posterior heel, he's been favoring the right foot and his heel pain has gotten worse. He has been using his crutches as instructed.   Objective:  Pain on the plantar medial aspect of the right heel is noted consistent with plantar fasciitis symptomatology.  Continued mild pain present posterior left heel at the achilles tendon consistent with a partial torn Achilles tendon her MRI report..   Assessment:  plantar fasciitis right ,Torn Achilles tendon left - partial  Plan: The patient stated that the injection for the right heel was maximally beneficial there for another injection was carried out today without complication under sterile technique with Kenalog and Marcaine mixture. Recommended that he stay in his boot for 4 weeks then he may slowly wean out into a ankle stabilizing brace. A prescription was written for the ankle stabilizing brace such that it could be covered by insurance. He'll be seen back as needed for followup or if the pain fails to subside.

## 2014-07-20 NOTE — Patient Instructions (Signed)
Wear your boot consistently for 4 more weeks-- then wean out into a lace up ankle brace-  Try a medical supply store for the brace-- if they don't cover under your insurance, take your prescription to [facility] in [place] and they can get you the brace covered (call before you go)-- [address]  [place] [address]  [phone]

## 2014-08-04 ENCOUNTER — Other Ambulatory Visit: Payer: Self-pay | Admitting: Podiatrist

## 2014-08-08 ENCOUNTER — Encounter (HOSPITAL_COMMUNITY): Payer: Self-pay | Admitting: Emergency Medicine

## 2014-08-08 ENCOUNTER — Emergency Department (HOSPITAL_COMMUNITY)
Admission: EM | Admit: 2014-08-08 | Discharge: 2014-08-09 | Disposition: A | Discharge status: Home / Self Care | Payer: Medicaid Other | Attending: Emergency Medicine | Admitting: Emergency Medicine

## 2014-08-08 DIAGNOSIS — E119 Type 2 diabetes mellitus without complications: Secondary | ICD-10-CM | POA: Diagnosis not present

## 2014-08-08 DIAGNOSIS — S8990XA Unspecified injury of unspecified lower leg, initial encounter: Secondary | ICD-10-CM | POA: Diagnosis present

## 2014-08-08 DIAGNOSIS — Z79899 Other long term (current) drug therapy: Secondary | ICD-10-CM | POA: Insufficient documentation

## 2014-08-08 DIAGNOSIS — S9000XA Contusion of unspecified ankle, initial encounter: Secondary | ICD-10-CM | POA: Insufficient documentation

## 2014-08-08 DIAGNOSIS — F319 Bipolar disorder, unspecified: Secondary | ICD-10-CM | POA: Insufficient documentation

## 2014-08-08 DIAGNOSIS — F209 Schizophrenia, unspecified: Secondary | ICD-10-CM | POA: Diagnosis not present

## 2014-08-08 DIAGNOSIS — Z91040 Latex allergy status: Secondary | ICD-10-CM | POA: Diagnosis not present

## 2014-08-08 DIAGNOSIS — Y92410 Unspecified street and highway as the place of occurrence of the external cause: Secondary | ICD-10-CM | POA: Diagnosis not present

## 2014-08-08 DIAGNOSIS — S80811A Abrasion, right lower leg, initial encounter: Secondary | ICD-10-CM

## 2014-08-08 DIAGNOSIS — S99919A Unspecified injury of unspecified ankle, initial encounter: Secondary | ICD-10-CM

## 2014-08-08 DIAGNOSIS — F17200 Nicotine dependence, unspecified, uncomplicated: Secondary | ICD-10-CM | POA: Diagnosis not present

## 2014-08-08 DIAGNOSIS — Z23 Encounter for immunization: Secondary | ICD-10-CM | POA: Diagnosis not present

## 2014-08-08 DIAGNOSIS — Y9389 Activity, other specified: Secondary | ICD-10-CM | POA: Insufficient documentation

## 2014-08-08 DIAGNOSIS — S99929A Unspecified injury of unspecified foot, initial encounter: Secondary | ICD-10-CM

## 2014-08-08 DIAGNOSIS — T148XXA Other injury of unspecified body region, initial encounter: Secondary | ICD-10-CM
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
## 2014-08-09 ENCOUNTER — Emergency Department (HOSPITAL_COMMUNITY): Payer: Medicaid Other

## 2014-08-09 MED ORDER — IBUPROFEN 600 MG PO TABS: 600.0000 mg | ORAL_TABLET | Freq: Four times a day (QID) | ORAL | Status: DC | PRN

## 2014-08-09 MED ORDER — TETANUS-DIPHTH-ACELL PERTUSSIS 5-2.5-18.5 LF-MCG/0.5 IM SUSP
0.5000 mL | Freq: Once | INTRAMUSCULAR | Status: AC
  Administered 2014-08-09: 0.5 mL via INTRAMUSCULAR
  Filled 2014-08-09: qty 0.5

## 2014-08-09 MED ORDER — IBUPROFEN 200 MG PO TABS
600.0000 mg | ORAL_TABLET | Freq: Once | ORAL | Status: AC
  Administered 2014-08-09: 600 mg via ORAL
  Filled 2014-08-09: qty 3

## 2014-08-09 MED ORDER — SULFAMETHOXAZOLE-TRIMETHOPRIM 800-160 MG PO TABS
1.0000 | ORAL_TABLET | Freq: Two times a day (BID) | ORAL | Status: DC
Start: 2014-08-09 — End: 2016-08-27

## 2014-08-09 MED ORDER — BACITRACIN ZINC 500 UNIT/GM EX OINT
1.0000 "application " | TOPICAL_OINTMENT | Freq: Two times a day (BID) | CUTANEOUS | Status: DC
  Administered 2014-08-09: 1 via TOPICAL
  Filled 2014-08-09: qty 15

## 2014-08-09 MED ORDER — OXYCODONE-ACETAMINOPHEN 5-325 MG PO TABS
2.0000 | ORAL_TABLET | Freq: Once | ORAL | Status: AC
  Administered 2014-08-09: 2 via ORAL
  Filled 2014-08-09: qty 2

## 2014-08-09 MED ORDER — HYDROCODONE-ACETAMINOPHEN 5-325 MG PO TABS: 1.0000 | ORAL_TABLET | Freq: Four times a day (QID) | ORAL | Status: DC | PRN
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
## 2014-08-11 DIAGNOSIS — M722 Plantar fascial fibromatosis: Secondary | ICD-10-CM

## 2014-08-19 ENCOUNTER — Ambulatory Visit: Payer: Medicaid Other

## 2014-09-07 ENCOUNTER — Encounter: Payer: Self-pay | Admitting: Podiatrist

## 2014-09-07 ENCOUNTER — Ambulatory Visit (INDEPENDENT_AMBULATORY_CARE_PROVIDER_SITE_OTHER): Payer: Medicaid Other | Admitting: Podiatrist

## 2014-09-07 VITALS — BP 128/90 | HR 99 | Resp 12

## 2014-09-07 DIAGNOSIS — M67972 Unspecified disorder of synovium and tendon, left ankle and foot: Secondary | ICD-10-CM

## 2014-09-07 DIAGNOSIS — G629 Polyneuropathy, unspecified: Secondary | ICD-10-CM

## 2014-09-07 DIAGNOSIS — M722 Plantar fascial fibromatosis: Secondary | ICD-10-CM

## 2014-09-07 MED ORDER — PREGABALIN 150 MG PO CAPS: 150.0000 mg | ORAL_CAPSULE | Freq: Two times a day (BID) | ORAL | Status: DC

## 2014-09-07 NOTE — Progress Notes (Signed)
Subjective: Patient presents today for followup of pain posterior heel and Achilles tendon left as well as plantar fasciitis right. He states he's been wearing the braces on both feet and is still having burning type of pain in both heels. She states the injections are of no benefit.  He also states he is already on Gabapentin for the diabetic neuropathy but has tried lyrica in the past that seems to have helped in addition to the gabapentin. He also wonders if he needs to have surgery to fix the heels.    Objective: neurovascular status is intact. With pedal pulses palpable and diabetic periopheral neuropathy present.  Continued burning Pain on the plantar medial aspect of the right and left heel is noted consistent with plantar fasciitis symptomatology versus neuropathy. Continued mild pain present posterior left heel at the achilles tendon consistent with a partial torn Achilles tendon her MRI report.Jillyn Hidden.   xrays show a large heel spur on the right foot in comparison to the left.  The spur is both posterior and plantar.    Assessment: neuropathy, plantar fasciitis right ,Torn Achilles tendon left - partial   Plan: agreed on the lyrica medication.  A prescription was written for him to take 150mg  bid.  He will see if this helps with the pain.  Would want to be sure the pain is stemming from the plantar fasciitis prior to considering any type of surgery.  He is not a good surgical candidate at this time. He will try the lyrica first to see how it does for the pain.

## 2014-09-07 NOTE — Patient Instructions (Signed)

## 2014-09-08 ENCOUNTER — Encounter: Payer: Self-pay | Admitting: Podiatrist

## 2014-11-02 DIAGNOSIS — M722 Plantar fascial fibromatosis: Secondary | ICD-10-CM

## 2014-11-08 ENCOUNTER — Other Ambulatory Visit: Payer: Self-pay | Admitting: Podiatrist

## 2014-11-11 ENCOUNTER — Other Ambulatory Visit: Payer: Self-pay | Admitting: Podiatrist

## 2015-01-18 ENCOUNTER — Other Ambulatory Visit: Payer: Self-pay | Admitting: Podiatrist
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
## 2016-05-08 ENCOUNTER — Encounter (HOSPITAL_COMMUNITY): Payer: Self-pay | Admitting: Emergency Medicine

## 2016-05-08 ENCOUNTER — Emergency Department (HOSPITAL_COMMUNITY)
Admission: EM | Admit: 2016-05-08 | Discharge: 2016-05-08 | Disposition: A | Discharge status: Home / Self Care | Payer: Medicaid Other | Attending: Emergency Medicine | Admitting: Emergency Medicine

## 2016-05-08 DIAGNOSIS — W19XXXA Unspecified fall, initial encounter: Secondary | ICD-10-CM

## 2016-05-08 DIAGNOSIS — M545 Low back pain: Secondary | ICD-10-CM | POA: Insufficient documentation

## 2016-05-08 DIAGNOSIS — F17200 Nicotine dependence, unspecified, uncomplicated: Secondary | ICD-10-CM | POA: Insufficient documentation

## 2016-05-08 DIAGNOSIS — E119 Type 2 diabetes mellitus without complications: Secondary | ICD-10-CM | POA: Insufficient documentation

## 2016-05-08 DIAGNOSIS — Z791 Long term (current) use of non-steroidal anti-inflammatories (NSAID): Secondary | ICD-10-CM | POA: Insufficient documentation

## 2016-05-08 DIAGNOSIS — G8929 Other chronic pain: Secondary | ICD-10-CM | POA: Insufficient documentation

## 2016-05-08 DIAGNOSIS — M549 Dorsalgia, unspecified: Secondary | ICD-10-CM

## 2016-05-08 DIAGNOSIS — F319 Bipolar disorder, unspecified: Secondary | ICD-10-CM | POA: Insufficient documentation

## 2016-05-08 DIAGNOSIS — Z79899 Other long term (current) drug therapy: Secondary | ICD-10-CM | POA: Insufficient documentation

## 2016-05-08 MED ORDER — METHOCARBAMOL 500 MG PO TABS: 1000.0000 mg | ORAL_TABLET | Freq: Four times a day (QID) | ORAL | Status: DC | PRN

## 2016-05-08 MED ORDER — HYDROCODONE-ACETAMINOPHEN 5-325 MG PO TABS
1.0000 | ORAL_TABLET | Freq: Once | ORAL | Status: AC
  Administered 2016-05-08: 1 via ORAL
  Filled 2016-05-08: qty 1

## 2016-05-08 MED ORDER — METHOCARBAMOL 500 MG PO TABS
500.0000 mg | ORAL_TABLET | Freq: Once | ORAL | Status: AC
  Administered 2016-05-08: 500 mg via ORAL
  Filled 2016-05-08: qty 1

## 2016-05-08 MED ORDER — HYDROCODONE-ACETAMINOPHEN 5-325 MG PO TABS
ORAL_TABLET | ORAL | Status: DC
Start: 2016-05-08 — End: 2016-08-17

## 2016-05-08 NOTE — ED Provider Notes (Signed)
CSN: [id number]     Arrival date & time 05/08/16  1755 History  By signing my name below, I, Renetta Chalk, attest that this documentation has been prepared under the direction and in the presence of [employer].  Electronically Signed: Renetta Chalk, ED Scribe. 04/15/2016. 4:01 PM.  Chief Complaint  Patient presents with  . Fall  . Back Pain   HPI HPI Comments: DRAEDEN KELLMAN is a 40 y.o. male with a PMHx of DM, who presents to the Emergency Department complaining constant, gradually worsening lower and mid back pain s/p a fall 5 days ago. Pt states he fell forward, landing on his hands with arms extended. Pt states he is having trouble walking and sleeping due to the pain. Pt states he was seeing a pain specialist for his chronic back pain but stopped in November 2016 due to loss of Medicaid coverage. Pt states he has taken ibuprofen at home with no relief. Pt denies any known drug allergies.  Past Medical History  Diagnosis Date  . Diabetes mellitus   . Bipolar 1 disorder (HCC)   . Schizophrenia [facility])    Past Surgical History  Procedure Laterality Date  . Knee surgery    . Hand surgery     History reviewed. No pertinent family history. Social History  Substance Use Topics  . Smoking status: Current Every Day Smoker -- 1.00 packs/day  . Smokeless tobacco: Never Used  . Alcohol Use: Yes    Review of Systems  A complete 10 system review of systems was obtained and all systems are negative except as noted in the HPI and PMH.    Allergies  Latex  Home Medications   Prior to Admission medications   Medication Sig Start Date End Date Taking? Authorizing Provider  atorvastatin (LIPITOR) 10 MG tablet Take 10 mg by mouth daily.    Historical Provider, MD  buPROPion (WELLBUTRIN XL) 150 MG 24 hr tablet Take 150 mg by mouth daily.    Historical Provider, MD  diclofenac (VOLTAREN) 75 MG EC tablet Take 75 mg by mouth 2 (two) times daily.    Historical Provider, MD  diclofenac  (VOLTAREN) 75 MG EC tablet TAKE 1 TABLET BY MOUTH TWICE A DAY 11/10/14   Delories Heinz, DPM  diclofenac (VOLTAREN) 75 MG EC tablet TAKE 1 TABLET BY MOUTH TWICE A DAY 11/15/14   Delories Heinz, DPM  gabapentin (NEURONTIN) 300 MG capsule Take 600 mg by mouth 4 (four) times daily.    Historical Provider, MD  glipiZIDE (GLUCOTROL) 10 MG tablet Take 10 mg by mouth 2 (two) times daily.    Historical Provider, MD  HYDROcodone-acetaminophen (NORCO/VICODIN) 5-325 MG per tablet Take 1 tablet by mouth every 6 (six) hours as needed. 08/09/14   Derwood Kaplan, MD  ibuprofen (ADVIL,MOTRIN) 600 MG tablet Take 1 tablet (600 mg total) by mouth every 6 (six) hours as needed. 08/09/14   Derwood Kaplan, MD  lithium carbonate 300 MG capsule Take 600 mg by mouth 3 (three) times daily.     Historical Provider, MD  LYRICA 150 MG capsule TAKE ONE CAPSULE BY MOUTH TWICE A DAY 01/21/15   Delories Heinz, DPM  metFORMIN (GLUCOPHAGE) 500 MG tablet Take 500 mg by mouth 2 (two) times daily with a meal.     Historical Provider, MD  OLANZapine (ZYPREXA) 10 MG tablet Take 10 mg by mouth at bedtime.    Historical Provider, MD  OxyCODONE (OXYCONTIN) 20 mg T12A 12 hr tablet Take  20 mg by mouth every 12 (twelve) hours.    Historical Provider, MD  Oxycodone HCl 10 MG TABS Take 10 mg by mouth every 4 (four) hours as needed (pain).    Historical Provider, MD  pantoprazole (PROTONIX) 40 MG tablet Take 80 mg by mouth daily.    Historical Provider, MD  sitaGLIPtin (JANUVIA) 100 MG tablet Take 100 mg by mouth daily.    Historical Provider, MD  sulfamethoxazole-trimethoprim (SEPTRA DS) 800-160 MG per tablet Take 1 tablet by mouth every 12 (twelve) hours. 08/09/14   Derwood KaplanAnkit Nanavati, MD  traZODone (DESYREL) 150 MG tablet Take 150-300 mg by mouth at bedtime.    Historical Provider, MD  Vitamin D, Ergocalciferol, (DRISDOL) 50000 UNITS CAPS capsule Take 50,000 Units by mouth every 7 (seven) days.    Historical Provider, MD   BP 152/81 mmHg   Pulse 73  Temp(Src) 98.2 F (36.8 C) (Oral)  Resp 20  SpO2 100% Physical Exam  Constitutional: He appears well-developed and well-nourished.  HENT:  Head: Normocephalic.  Eyes: Conjunctivae are normal.  Neck: Normal range of motion.  Cardiovascular: Normal rate, regular rhythm and intact distal pulses.   Pulmonary/Chest: Effort normal.  Abdominal: Soft. There is no tenderness.  Musculoskeletal: He exhibits tenderness.  Neurological: He is alert.  No point tenderness to percussion of lumbar spinal processes.  Diffusely exquisitely tender to light palpation over left and right lumbar paraspinal musculature. Strength is 5 out of 5 to bilateral lower extremities at hip and knee; extensor hallucis longus 5 out of 5. Ankle strength 5 out of 5, no clonus, neurovascularly intact. No saddle anaesthesia. Patellar reflexes are 2+ bilaterally.       Psychiatric: He has a normal mood and affect.  Nursing note and vitals reviewed.   ED Course  Procedures  DIAGNOSTIC STUDIES: Oxygen Saturation is 100% on RA, normal by my interpretation.  COORDINATION OF CARE: 6:38 PM-Will order medication for pain. Discussed treatment plan with pt at bedside and pt agreed to plan.   Labs Review Labs Reviewed - No data to display  Imaging Review No results found. I have personally reviewed and evaluated these images and lab results as part of my medical decision-making.   EKG Interpretation None      MDM   Final diagnoses:  Fall, initial encounter  Exacerbation of chronic back pain    Filed Vitals:   05/08/16 1820  BP: 152/81  Pulse: 73  Temp: 98.2 F (36.8 C)  TempSrc: Oral  Resp: 20  SpO2: 100%    Medications  HYDROcodone-acetaminophen (NORCO/VICODIN) 5-325 MG per tablet 1 tablet (1 tablet Oral Given 05/08/16 1843)  methocarbamol (ROBAXIN) tablet 500 mg (500 mg Oral Given 05/08/16 1843)    Jonny RuizJames O Garciamartinez is 40 y.o. male presenting with Exacerbation of chronic low back pain after  patient fell onto outstretched hand several days ago. There was no back trauma. Neurologic exam nonfocal.  Evaluation does not show pathology that would require ongoing emergent intervention or inpatient treatment. Pt is hemodynamically stable and mentating appropriately. Discussed findings and plan with patient/guardian, who agrees with care plan. All questions answered. Return precautions discussed and outpatient follow up given.   Discharge Medication List as of 05/08/2016  6:37 PM    START taking these medications   Details  methocarbamol (ROBAXIN) 500 MG tablet Take 2 tablets (1,000 mg total) by mouth 4 (four) times daily as needed (Pain)., Starting 05/08/2016, Until Discontinued, Print         I personally  performed the services described in this documentation, which was scribed in my presence. The recorded information has been reviewed and is accurate.     Wynetta Emery, PA-C 05/08/16 1913  Doug Sou, MD 05/08/16 2242

## 2016-05-08 NOTE — Discharge Instructions (Signed)
Please take ibuprofen 400mg  (this is normally 2 over the counter pills) every 6 hours (take with food to minimze stomach irritation).   Take robaxin and/or Vicodin for breakthrough pain, do not drink alcohol, drive, care for children or perfom other critical tasks while taking robaxin and/or Vicodin .  Please follow with your primary care doctor in the next 2 days for a check-up. They must obtain records for further management.   Do not hesitate to return to the Emergency Department for any new, worsening or concerning symptoms.   Please follow with your primary care doctor in the next 2 days for a check-up. They must obtain records for further management.   Do not hesitate to return to the Emergency Department for any new, worsening or concerning symptoms.   Chronic Back Pain  When back pain lasts longer than 3 months, it is called chronic back pain.People with chronic back pain often go through certain periods that are more intense (flare-ups).  CAUSES Chronic back pain can be caused by wear and tear (degeneration) on different structures in your back. These structures include:  The bones of your spine (vertebrae) and the joints surrounding your spinal cord and nerve roots (facets).  The strong, fibrous tissues that connect your vertebrae (ligaments). Degeneration of these structures may result in pressure on your nerves. This can lead to constant pain. HOME CARE INSTRUCTIONS  Avoid bending, heavy lifting, prolonged sitting, and activities which make the problem worse.  Take brief periods of rest throughout the day to reduce your pain. Lying down or standing usually is better than sitting while you are resting.  Take over-the-counter or prescription medicines only as directed by your caregiver. SEEK IMMEDIATE MEDICAL CARE IF:   You have weakness or numbness in one of your legs or feet.  You have trouble controlling your bladder or bowels.  You have nausea, vomiting, abdominal  pain, shortness of breath, or fainting.   This information is not intended to replace advice given to you by your health care provider. Make sure you discuss any questions you have with your health care provider.   Document Released: 12/13/2004 Document Revised: 01/28/2012 Document Reviewed: 04/25/2015 Elsevier Interactive Patient Education [employer]2016 [employer]

## 2016-05-08 NOTE — ED Notes (Signed)
Pt ambulatory and independent at discharge.  

## 2016-05-08 NOTE — ED Notes (Signed)
Pt states that he has chronic back problems but fell 5 days ago, landing on his hands, and has had worsening pain since that time. Alert and oriented. Tried ibuprofen at home w/o relief.

## 2016-08-16 ENCOUNTER — Encounter (HOSPITAL_COMMUNITY): Payer: Self-pay

## 2016-08-16 ENCOUNTER — Emergency Department (HOSPITAL_COMMUNITY)
Admission: EM | Admit: 2016-08-16 | Discharge: 2016-08-17 | Disposition: A | Discharge status: Other Acute Inpatient Hospital | Payer: Federal, State, Local not specified - Other | Attending: Emergency Medicine | Admitting: Emergency Medicine

## 2016-08-16 DIAGNOSIS — F17200 Nicotine dependence, unspecified, uncomplicated: Secondary | ICD-10-CM | POA: Insufficient documentation

## 2016-08-16 DIAGNOSIS — F32A Depression, unspecified: Secondary | ICD-10-CM

## 2016-08-16 DIAGNOSIS — E119 Type 2 diabetes mellitus without complications: Secondary | ICD-10-CM | POA: Insufficient documentation

## 2016-08-16 DIAGNOSIS — F4310 Post-traumatic stress disorder, unspecified: Secondary | ICD-10-CM | POA: Diagnosis present

## 2016-08-16 DIAGNOSIS — F315 Bipolar disorder, current episode depressed, severe, with psychotic features: Secondary | ICD-10-CM | POA: Insufficient documentation

## 2016-08-16 DIAGNOSIS — F314 Bipolar disorder, current episode depressed, severe, without psychotic features: Secondary | ICD-10-CM | POA: Diagnosis present

## 2016-08-16 DIAGNOSIS — Z79899 Other long term (current) drug therapy: Secondary | ICD-10-CM | POA: Insufficient documentation

## 2016-08-16 DIAGNOSIS — F329 Major depressive disorder, single episode, unspecified: Secondary | ICD-10-CM

## 2016-08-16 DIAGNOSIS — Z91040 Latex allergy status: Secondary | ICD-10-CM | POA: Insufficient documentation

## 2016-08-16 DIAGNOSIS — Z7984 Long term (current) use of oral hypoglycemic drugs: Secondary | ICD-10-CM | POA: Insufficient documentation

## 2016-08-16 DIAGNOSIS — R45851 Suicidal ideations: Secondary | ICD-10-CM

## 2016-08-16 HISTORY — DX: Post-traumatic stress disorder, unspecified: F43.10

## 2016-08-16 LAB — COMPREHENSIVE METABOLIC PANEL
ALT: 14 U/L — ABNORMAL LOW (ref 17–63)
AST: 15 U/L (ref 15–41)
Albumin: 4.4 g/dL (ref 3.5–5.0)
Alkaline Phosphatase: 74 U/L (ref 38–126)
Anion gap: 10 (ref 5–15)
BUN: 10 mg/dL (ref 6–20)
CO2: 23 mmol/L (ref 22–32)
Calcium: 9.7 mg/dL (ref 8.9–10.3)
Chloride: 101 mmol/L (ref 101–111)
Creatinine, Ser: 0.78 mg/dL (ref 0.61–1.24)
GFR calc Af Amer: >60 mL/min (ref >60)
GFR calc non Af Amer: >60 mL/min (ref >60)
Glucose, Bld: 310 mg/dL — ABNORMAL HIGH (ref 65–99)
Potassium: 3.9 mmol/L (ref 3.5–5.1)
Sodium: 134 mmol/L — ABNORMAL LOW (ref 135–145)
Total Bilirubin: 0.3 mg/dL (ref 0.3–1.2)
Total Protein: 7.8 g/dL (ref 6.5–8.1)

## 2016-08-16 LAB — CBC
HCT: 49.3 % (ref 39.0–52.0)
Hemoglobin: 17.8 g/dL — ABNORMAL HIGH (ref 13.0–17.0)
MCH: 31.4 pg (ref 26.0–34.0)
MCHC: 36.1 g/dL — ABNORMAL HIGH (ref 30.0–36.0)
MCV: 86.9 fL (ref 78.0–100.0)
Platelets: 257 10*3/uL (ref 150–400)
RBC: 5.67 MIL/uL (ref 4.22–5.81)
RDW: 13.1 % (ref 11.5–15.5)
WBC: 15 10*3/uL — ABNORMAL HIGH (ref 4.0–10.5)

## 2016-08-16 LAB — RAPID URINE DRUG SCREEN, HOSP PERFORMED
Amphetamines: NOT DETECTED
Barbiturates: NOT DETECTED
Benzodiazepines: POSITIVE — AB
Cocaine: NOT DETECTED
Opiates: NOT DETECTED
Tetrahydrocannabinol: POSITIVE — AB

## 2016-08-16 LAB — ACETAMINOPHEN LEVEL: Acetaminophen (Tylenol), Serum: <10 ug/mL — ABNORMAL LOW (ref 10–30)

## 2016-08-16 LAB — SALICYLATE LEVEL: Salicylate Lvl: <4 mg/dL (ref 2.8–30.0)

## 2016-08-16 LAB — ETHANOL: Alcohol, Ethyl (B): <5 mg/dL (ref <5)

## 2016-08-16 MED ORDER — METFORMIN HCL 500 MG PO TABS
500.0000 mg | ORAL_TABLET | Freq: Once | ORAL | Status: AC
  Administered 2016-08-16: 500 mg via ORAL
  Filled 2016-08-16: qty 1

## 2016-08-16 MED ORDER — SODIUM CHLORIDE 0.9 % IV BOLUS (SEPSIS)
1000.0000 mL | Freq: Once | INTRAVENOUS | Status: AC
  Administered 2016-08-16: 1000 mL via INTRAVENOUS

## 2016-08-16 MED ORDER — NICOTINE 21 MG/24HR TD PT24
21.0000 mg | MEDICATED_PATCH | Freq: Every day | TRANSDERMAL | Status: DC
  Administered 2016-08-16 – 2016-08-17 (×2): 21 mg via TRANSDERMAL
  Filled 2016-08-16 (×2): qty 1
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
## 2016-08-16 NOTE — ED Notes (Signed)
Bed: WTR5 Expected date:  Expected time:  Means of arrival:  Comments: 

## 2016-08-16 NOTE — ED Triage Notes (Signed)
Pt reports SI when plan being "suicide by cop" for the past x2 weeks. Pt has hx of intentional ingestion of Rx overdose. Pt admits to smoking marijuana recently.

## 2016-08-17 ENCOUNTER — Encounter (HOSPITAL_COMMUNITY): Payer: Self-pay | Admitting: *Deleted

## 2016-08-17 ENCOUNTER — Inpatient Hospital Stay (HOSPITAL_COMMUNITY)
Admission: AD | Admit: 2016-08-17 | Discharge: 2016-08-27 | DRG: 885 | Disposition: A | Discharge status: Home / Self Care | Payer: Federal, State, Local not specified - Other | Source: Intra-hospital | Attending: Psychiatry | Admitting: Psychiatry

## 2016-08-17 DIAGNOSIS — F4310 Post-traumatic stress disorder, unspecified: Secondary | ICD-10-CM | POA: Diagnosis present

## 2016-08-17 DIAGNOSIS — F1290 Cannabis use, unspecified, uncomplicated: Secondary | ICD-10-CM | POA: Diagnosis present

## 2016-08-17 DIAGNOSIS — K219 Gastro-esophageal reflux disease without esophagitis: Secondary | ICD-10-CM | POA: Diagnosis present

## 2016-08-17 DIAGNOSIS — F3162 Bipolar disorder, current episode mixed, moderate: Secondary | ICD-10-CM | POA: Diagnosis not present

## 2016-08-17 DIAGNOSIS — F17200 Nicotine dependence, unspecified, uncomplicated: Secondary | ICD-10-CM | POA: Diagnosis present

## 2016-08-17 DIAGNOSIS — R45851 Suicidal ideations: Secondary | ICD-10-CM | POA: Diagnosis present

## 2016-08-17 DIAGNOSIS — M549 Dorsalgia, unspecified: Secondary | ICD-10-CM | POA: Diagnosis present

## 2016-08-17 DIAGNOSIS — T43595A Adverse effect of other antipsychotics and neuroleptics, initial encounter: Secondary | ICD-10-CM | POA: Diagnosis not present

## 2016-08-17 DIAGNOSIS — Z91040 Latex allergy status: Secondary | ICD-10-CM | POA: Diagnosis not present

## 2016-08-17 DIAGNOSIS — F319 Bipolar disorder, unspecified: Secondary | ICD-10-CM | POA: Diagnosis present

## 2016-08-17 DIAGNOSIS — Z818 Family history of other mental and behavioral disorders: Secondary | ICD-10-CM

## 2016-08-17 DIAGNOSIS — Z791 Long term (current) use of non-steroidal anti-inflammatories (NSAID): Secondary | ICD-10-CM | POA: Diagnosis not present

## 2016-08-17 DIAGNOSIS — G2571 Drug induced akathisia: Secondary | ICD-10-CM | POA: Diagnosis not present

## 2016-08-17 DIAGNOSIS — F419 Anxiety disorder, unspecified: Secondary | ICD-10-CM | POA: Diagnosis present

## 2016-08-17 DIAGNOSIS — F314 Bipolar disorder, current episode depressed, severe, without psychotic features: Secondary | ICD-10-CM | POA: Diagnosis present

## 2016-08-17 DIAGNOSIS — G4700 Insomnia, unspecified: Secondary | ICD-10-CM | POA: Diagnosis present

## 2016-08-17 DIAGNOSIS — G8929 Other chronic pain: Secondary | ICD-10-CM | POA: Diagnosis present

## 2016-08-17 DIAGNOSIS — Z9114 Patient's other noncompliance with medication regimen: Secondary | ICD-10-CM

## 2016-08-17 DIAGNOSIS — F209 Schizophrenia, unspecified: Secondary | ICD-10-CM | POA: Diagnosis present

## 2016-08-17 DIAGNOSIS — E1165 Type 2 diabetes mellitus with hyperglycemia: Secondary | ICD-10-CM | POA: Diagnosis present

## 2016-08-17 DIAGNOSIS — Z8052 Family history of malignant neoplasm of bladder: Secondary | ICD-10-CM | POA: Diagnosis not present

## 2016-08-17 DIAGNOSIS — Z79899 Other long term (current) drug therapy: Secondary | ICD-10-CM | POA: Diagnosis not present

## 2016-08-17 DIAGNOSIS — Z915 Personal history of self-harm: Secondary | ICD-10-CM

## 2016-08-17 DIAGNOSIS — Z23 Encounter for immunization: Secondary | ICD-10-CM

## 2016-08-17 DIAGNOSIS — F3132 Bipolar disorder, current episode depressed, moderate: Secondary | ICD-10-CM | POA: Diagnosis not present

## 2016-08-17 DIAGNOSIS — F17210 Nicotine dependence, cigarettes, uncomplicated: Secondary | ICD-10-CM | POA: Diagnosis not present

## 2016-08-17 LAB — GLUCOSE, CAPILLARY: GLUCOSE-CAPILLARY: 237 mg/dL — AB (ref 65–99)

## 2016-08-17 LAB — CBG MONITORING, ED
GLUCOSE-CAPILLARY: 249 mg/dL — AB (ref 65–99)
Glucose-Capillary: 198 mg/dL — ABNORMAL HIGH (ref 65–99)

## 2016-08-17 MED ORDER — CARBAMAZEPINE 200 MG PO TABS
200.0000 mg | ORAL_TABLET | Freq: Two times a day (BID) | ORAL | Status: DC
  Administered 2016-08-17 – 2016-08-22 (×10): 200 mg via ORAL
  Filled 2016-08-17 (×15): qty 1

## 2016-08-17 MED ORDER — METFORMIN HCL 500 MG PO TABS
500.0000 mg | ORAL_TABLET | Freq: Two times a day (BID) | ORAL | Status: DC
  Administered 2016-08-17 – 2016-08-27 (×20): 500 mg via ORAL
  Filled 2016-08-17 (×3): qty 1
  Filled 2016-08-17: qty 14
  Filled 2016-08-17 (×14): qty 1
  Filled 2016-08-17: qty 14
  Filled 2016-08-17 (×7): qty 1

## 2016-08-17 MED ORDER — ACETAMINOPHEN 325 MG PO TABS
650.0000 mg | ORAL_TABLET | ORAL | Status: DC | PRN
Start: 2016-08-17 — End: 2016-08-17

## 2016-08-17 MED ORDER — ALUM & MAG HYDROXIDE-SIMETH 200-200-20 MG/5ML PO SUSP
30.0000 mL | ORAL | Status: DC | PRN
  Administered 2016-08-20 – 2016-08-22 (×2): 30 mL via ORAL
  Filled 2016-08-17 (×2): qty 30

## 2016-08-17 MED ORDER — IBUPROFEN 200 MG PO TABS
600.0000 mg | ORAL_TABLET | Freq: Three times a day (TID) | ORAL | Status: DC | PRN
  Administered 2016-08-17: 600 mg via ORAL
  Filled 2016-08-17: qty 3

## 2016-08-17 MED ORDER — PNEUMOCOCCAL VAC POLYVALENT 25 MCG/0.5ML IJ INJ
0.5000 mL | INJECTION | INTRAMUSCULAR | Status: AC
  Administered 2016-08-18: 0.5 mL via INTRAMUSCULAR

## 2016-08-17 MED ORDER — TRAZODONE HCL 150 MG PO TABS: 150.0000 mg | ORAL_TABLET | Freq: Every day | ORAL | Status: DC

## 2016-08-17 MED ORDER — ACETAMINOPHEN 325 MG PO TABS: 650.0000 mg | ORAL_TABLET | ORAL | Status: DC | PRN

## 2016-08-17 MED ORDER — NICOTINE POLACRILEX 2 MG MT GUM
2.0000 mg | CHEWING_GUM | OROMUCOSAL | Status: DC | PRN
  Administered 2016-08-17 – 2016-08-26 (×35): 2 mg via ORAL
  Filled 2016-08-17 (×8): qty 1

## 2016-08-17 MED ORDER — ZOLPIDEM TARTRATE 5 MG PO TABS: 5.0000 mg | ORAL_TABLET | Freq: Every evening | ORAL | Status: DC | PRN

## 2016-08-17 MED ORDER — CARBAMAZEPINE 200 MG PO TABS
200.0000 mg | ORAL_TABLET | Freq: Two times a day (BID) | ORAL | Status: DC
  Administered 2016-08-17: 200 mg via ORAL
  Filled 2016-08-17: qty 1

## 2016-08-17 MED ORDER — ONDANSETRON HCL 4 MG PO TABS: 4.0000 mg | ORAL_TABLET | Freq: Three times a day (TID) | ORAL | Status: DC | PRN

## 2016-08-17 MED ORDER — LORAZEPAM 1 MG PO TABS: 1.0000 mg | ORAL_TABLET | Freq: Three times a day (TID) | ORAL | Status: DC | PRN

## 2016-08-17 MED ORDER — METFORMIN HCL 500 MG PO TABS
500.0000 mg | ORAL_TABLET | Freq: Two times a day (BID) | ORAL | Status: DC
  Administered 2016-08-17: 500 mg via ORAL
  Filled 2016-08-17: qty 1

## 2016-08-17 MED ORDER — ALUM & MAG HYDROXIDE-SIMETH 200-200-20 MG/5ML PO SUSP: 30.0000 mL | ORAL | Status: DC | PRN

## 2016-08-17 MED ORDER — TRAZODONE HCL 100 MG PO TABS
200.0000 mg | ORAL_TABLET | Freq: Every day | ORAL | Status: DC
  Administered 2016-08-17 – 2016-08-19 (×3): 200 mg via ORAL
  Filled 2016-08-17 (×7): qty 2

## 2016-08-17 MED ORDER — HALOPERIDOL 1 MG PO TABS
1.0000 mg | ORAL_TABLET | Freq: Two times a day (BID) | ORAL | Status: DC
  Administered 2016-08-17 – 2016-08-18 (×3): 1 mg via ORAL
  Filled 2016-08-17 (×5): qty 1
  Filled 2016-08-17: qty 2
  Filled 2016-08-17: qty 1

## 2016-08-17 MED ORDER — MAGNESIUM HYDROXIDE 400 MG/5ML PO SUSP: 30.0000 mL | Freq: Every day | ORAL | Status: DC | PRN

## 2016-08-17 MED ORDER — METFORMIN HCL 500 MG PO TABS: 500.0000 mg | ORAL_TABLET | Freq: Two times a day (BID) | ORAL | Status: DC

## 2016-08-17 MED ORDER — NICOTINE 21 MG/24HR TD PT24: 21.0000 mg | MEDICATED_PATCH | Freq: Every day | TRANSDERMAL | Status: DC

## 2016-08-17 MED ORDER — INFLUENZA VAC SPLIT QUAD 0.5 ML IM SUSY
0.5000 mL | PREFILLED_SYRINGE | INTRAMUSCULAR | Status: DC
  Filled 2016-08-17: qty 0.5

## 2016-08-17 MED ORDER — HALOPERIDOL 1 MG PO TABS
1.0000 mg | ORAL_TABLET | Freq: Two times a day (BID) | ORAL | Status: DC
  Administered 2016-08-17: 1 mg via ORAL
  Filled 2016-08-17: qty 1

## 2016-08-17 MED ORDER — IBUPROFEN 600 MG PO TABS
600.0000 mg | ORAL_TABLET | Freq: Three times a day (TID) | ORAL | Status: DC | PRN
  Administered 2016-08-17 – 2016-08-18 (×2): 600 mg via ORAL
  Filled 2016-08-17 (×2): qty 1

## 2016-08-17 NOTE — Progress Notes (Signed)
Patient ID: Mitchell George, male   DOB: 1976-07-19, 40 y.o.   MRN: [id number] Addition to admit note:  Patient states he takes "pain pills from friends."  He states he cannot take tylenol due to "a prior overdose of percocets."  He also states he is unable to tolerate aspirin.  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  He also has bilateral fascitis (plantar) and bone spurs.  He has total left knee replacement.

## 2016-08-17 NOTE — BH Assessment (Signed)
[facility] Assessment Progress Note  Per Thedore MinsMojeed Akintayo, MD, this pt requires psychiatric hospitalization at this time.  Berneice Heinrichina Tate, RN, [id number].  Pt has signed Voluntary Admission and Consent for Treatment, as well as Consent to Release Information to [facility] [place], his most recent outpatient provider, and a notification call has been placed.  [facility].  Pt's nurse, Johnny BridgeMartha, has been notified, and agrees to send original paperwork along with pt via [facility] [place], and to call report to [phone].  Doylene Canninghomas Undrea Shipes, MA Triage Specialist [phone]

## 2016-08-17 NOTE — ED Notes (Signed)
Bed: WA32 Expected date:  Expected time:  Means of arrival:  Comments: 

## 2016-08-17 NOTE — ED Notes (Signed)
[facility] with [facility] and family.

## 2016-08-17 NOTE — Tx Team (Signed)
Initial Treatment Plan 08/17/2016 6:21 PM Mitchell RuizJames O Credit [id number]    PATIENT STRESSORS: Financial difficulties Health problems Medication change or noncompliance Substance abuse   PATIENT STRENGTHS: Average or above average intelligence Communication skills Supportive family/friends   PATIENT IDENTIFIED PROBLEMS: Polysubstance abuse  Homelessness  Health Issues  Noncompliance with medications  Difficulty ambulating  Depression  Suicidal Ideation  Anger Issues  Lack of resources for health/psyche issues     DISCHARGE CRITERIA:  Improved stabilization in mood, thinking, and/or behavior Motivation to continue treatment in a less acute level of care Reduction of life-threatening or endangering symptoms to within safe limits Verbal commitment to aftercare and medication compliance  PRELIMINARY DISCHARGE PLAN: Attend 12-step recovery group Outpatient therapy  PATIENT/FAMILY INVOLVEMENT: This treatment plan has been presented to and reviewed with the patient, Mitchell RuizJames O Vandenheuvel.  The patient and family have been given the opportunity to ask questions and make suggestions.  Cranford MonBeaudry, Birdell Frasier Evans, RN 08/17/2016, 6:21 PM
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
## 2016-08-17 NOTE — Progress Notes (Signed)
Adult Psychoeducational Group Note  Date:  08/17/2016 Time:  11:54 PM  Group Topic/Focus:  Wrap-Up Group:   The focus of this group is to help patients review their daily goal of treatment and discuss progress on daily workbooks.   Participation Level:  Active  Participation Quality:  Appropriate  Affect:  Appropriate  Cognitive:  Appropriate  Insight: Appropriate  Engagement in Group:  Engaged  Modes of Intervention:  Discussion  Additional Comments:  Pt arrived earlier this evening therefore pt did not set a goal this AM. Pt discussed the reasons for the admission. Pt stated that he felt "Suicidal" and that the two options were to "end it or get help." Pt decided to get help. Pt was praised by other peers and this Clinical research associatewriter for choosing help. Margorie Johnege, [place] 08/17/2016, 11:54 PM
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
## 2016-08-18 DIAGNOSIS — Z791 Long term (current) use of non-steroidal anti-inflammatories (NSAID): Secondary | ICD-10-CM

## 2016-08-18 DIAGNOSIS — F3132 Bipolar disorder, current episode depressed, moderate: Secondary | ICD-10-CM

## 2016-08-18 DIAGNOSIS — Z818 Family history of other mental and behavioral disorders: Secondary | ICD-10-CM

## 2016-08-18 LAB — GLUCOSE, CAPILLARY
GLUCOSE-CAPILLARY: 193 mg/dL — AB (ref 65–99)
Glucose-Capillary: 312 mg/dL — ABNORMAL HIGH (ref 65–99)

## 2016-08-18 MED ORDER — ARIPIPRAZOLE 2 MG PO TABS
2.0000 mg | ORAL_TABLET | Freq: Every day | ORAL | Status: DC
  Administered 2016-08-19: 2 mg via ORAL
  Filled 2016-08-18 (×4): qty 1

## 2016-08-18 MED ORDER — LISINOPRIL 20 MG PO TABS
20.0000 mg | ORAL_TABLET | Freq: Once | ORAL | Status: DC
Start: 2016-08-18 — End: 2016-08-18

## 2016-08-18 MED ORDER — LISINOPRIL 10 MG PO TABS: 10.0000 mg | ORAL_TABLET | Freq: Every day | ORAL | Status: DC

## 2016-08-18 MED ORDER — GABAPENTIN 300 MG PO CAPS
600.0000 mg | ORAL_CAPSULE | Freq: Three times a day (TID) | ORAL | Status: DC
  Administered 2016-08-18 – 2016-08-19 (×5): 600 mg via ORAL
  Filled 2016-08-18 (×14): qty 2
  Filled 2016-08-18: qty 6
  Filled 2016-08-18: qty 2

## 2016-08-18 MED ORDER — METHOCARBAMOL 500 MG PO TABS
500.0000 mg | ORAL_TABLET | Freq: Three times a day (TID) | ORAL | Status: DC | PRN
  Administered 2016-08-18 – 2016-08-21 (×8): 500 mg via ORAL
  Filled 2016-08-18 (×9): qty 1

## 2016-08-18 MED ORDER — IBUPROFEN 800 MG PO TABS
800.0000 mg | ORAL_TABLET | Freq: Four times a day (QID) | ORAL | Status: DC | PRN
  Administered 2016-08-18 – 2016-08-24 (×18): 800 mg via ORAL
  Filled 2016-08-18 (×19): qty 1

## 2016-08-18 MED ORDER — GABAPENTIN 400 MG PO CAPS: 400.0000 mg | ORAL_CAPSULE | Freq: Three times a day (TID) | ORAL | Status: DC
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
## 2016-08-18 NOTE — Progress Notes (Signed)
Patient ID: Mitchell RuizJames O George, male   DOB: 05-23-1976, 40 y.o.   MRN: [id number]   D: [name] reports that he has been very depressed recently because of his mother having bladder cancer per patient. Reported having some SI and thought he needed to come to the hospital. Talked about all his medical issues from various incidents throughout his life. Wearing a back brace, ankle splints and boots. Patient turned in his ankle splints and boots at bedtime per doctor's order. No problems walking noted. Denied needing staff to help him to the bathroom but said he will notify us if any help needed. Reports had been taking vicodin's up until last November but realizes that he will probably not be able to get that here. Continues to have some passive SI but contracts. A: Staff will monitor on q 15 minute checks, follow treatment plan, and give medications as ordered. R: Cooperative with unit rules

## 2016-08-18 NOTE — Progress Notes (Signed)
D: Pt denies SI/HI/AVH. Pt is pleasant and cooperative. Pt focused on his pain this evening and stated the medications we were giving him were helping to some extent. Pt said he was trying to get his disability and then he could go to a Doctor on a regular basis.   A: Pt was offered support and encouragement. Pt was given scheduled medications. Pt was encourage to attend groups. Q 15 minute checks were done for safety.    R:Pt attends groups and interacts well with peers and staff. Pt is taking medication. Pt has no complaints.Pt receptive to treatment and safety maintained on unit.

## 2016-08-18 NOTE — Progress Notes (Signed)
Adult Psychoeducational Group Note  Date:  08/18/2016 Time:  9:13 PM  Group Topic/Focus:  Wrap-Up Group:   The focus of this group is to help patients review their daily goal of treatment and discuss progress on daily workbooks.   Participation Level:  Active  Participation Quality:  Appropriate  Affect:  Appropriate  Cognitive:  Alert, Appropriate and Oriented  Insight: Appropriate  Engagement in Group:  Engaged  Modes of Intervention:  Discussion  Additional Comments:  Patient attended wrap-up group and said that his day was a 5.  His goal for today was to get his medications right.  Patient said he met his goal.    Iona Coach Gertie Broerman 9/49/4473, 9:13 PM

## 2016-08-18 NOTE — Progress Notes (Signed)
Patient ID: Mitchell George, male   DOB: Apr 11, 1976, 40 y.o.   MRN: [id number]   D: Pt has been flat and depressed on the unit today, he did attend all groups and engaged in treatment. Pt just reported that he was in a lot of pain, Aggie NP made aware new orders noted. Pt was given a dose of Ibuprofen and Robaxin for pain, patient reported that the medication helped him. Pt reported that his depression was a 8, his hopelessness was a 8, and his anxiety was a 8. Pt reported that [facility]. Pt took all medications without any problems, no issues or concerns noted.  Pt reported being negative SI/HI, no AH/VH noted. A: 15 min checks continued for patient safety. R: Pt safety maintained.

## 2016-08-18 NOTE — [facility] Group Notes (Signed)
Adult Therapy Group Note  Date: 08/18/2016 Time:  10:00-11:00AM  Group Topic/Focus: Unhealthy Coping Skills versus Healthy Coping Skills  Building Self Esteem:   The Focus of this group was to assist patients in becoming aware of the differences between healthy and unhealthy coping techniques, as well as how to determine which type they are using.   Reasons for choosing the unhealthy techniques were explored, which helped patients to provide support to each other and to determine that, in fact, they are not alone.  Participation Level:  Active  Participation Quality:  Attentive and Sharing  Affect:  Appropriate  Cognitive:  Alert and Appropriate  Insight: Good  Engagement in Group:  Engaged  Modes of Intervention:  Discussion, Exploration and Support  Additional Comments:  The patient expressed that marijuana use and "punching things when upset" are the unhealthy coping mechanisms he uses.  He was fully engaged in the discussion and very supportive of others.  Carloyn JaegerMareida J Grossman-Orr 08/18/2016, 12:35 PM

## 2016-08-18 NOTE — [facility] Suicide Risk Assessment (Signed)
[facility]   Nursing information obtained from:   chart review, nursing report Demographic factors:   male Current Mental Status:   depressed Loss Factors:   recent loss of his best friend, mother diagnosed of bladder cancer Historical Factors:   previous suicide attempt Risk Reduction Factors:   responsibility to children, future oriented, seeking for help  Total Time spent with patient: 20 minutes Principal Problem: <principal problem not specified> Diagnosis:   Patient Active Problem List   Diagnosis Date Noted  . Bipolar 1 disorder, depressed, severe (HCC) [F31.4] 08/17/2016  . PTSD (post-traumatic stress disorder) [F43.10] 08/17/2016  . 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   Patient talks about significant stressors of recent loss of his best friend and his mother diagnosed of bladder cancer last month. He had a plan to sitting with a gun in his hand so that the police will shoot him. He did have AH of people talking about him. He reports history of decreased need for sleep for 4 days with euphoria, increased energy.   He reports past suicide attempts x4, including slitting his wrist with a sharp knife, last in 2015.   Continued Clinical Symptoms:  Alcohol Use Disorder Identification Test Final Score (AUDIT): 1 The "Alcohol Use Disorders Identification Test", Guidelines for Use in Primary Care, Second Edition.  World Science [employer]Health Organization [facility](WHO). Score between 0-7:  no or low risk or alcohol related problems. Score between 8-15:  moderate risk of alcohol related problems. Score between 16-19:  high risk of alcohol related problems. Score 20 or above:  warrants further diagnostic evaluation for alcohol dependence and treatment.   CLINICAL FACTORS:   Bipolar Disorder:   Depressive  phase   Musculoskeletal: Strength & Muscle Tone: within normal limits Gait & Station: normal Patient leans: N/A  Psychiatric Specialty Exam: Physical Exam  Nursing note and vitals reviewed. Constitutional: He is oriented to person, place, and time. He appears well-developed and well-nourished.  Neurological: He is alert and oriented to person, place, and time.  No tremors    Review of Systems  Psychiatric/Behavioral: Positive for depression and suicidal ideas. Negative for hallucinations and substance abuse. The patient is nervous/anxious. The patient does not have insomnia.   All other systems reviewed and are negative.   Blood pressure 129/69, pulse 85, temperature 97.5 F (36.4 C), resp. rate 18, height 6\' 1"  (1.854 m), weight 230 lb (104.3 kg), SpO2 98 %.Body mass index is 30.34 kg/m.  General Appearance: Casual  Eye Contact:  Good  Speech:  Clear and Coherent  Volume:  Normal  Mood:  Depressed  Affect:  Restricted  Thought Process:  Coherent and Goal Directed  Orientation:  Full (Time, Place, and Person)  Thought Content:  Logical  Suicidal Thoughts:  No  Homicidal Thoughts:  No  Memory:  Immediate;   Fair Recent;   Fair Remote;   Fair  Judgement:  Fair  Insight:  Fair  Psychomotor Activity:  Normal  Concentration:  Concentration: Fair and Attention Span: Fair  Recall:  [employer]Fair  Fund of Knowledge:  Fair  Language:  Fair  Akathisia:  No  Handed:  Right  AIMS (if indicated):     Assets:  Communication Skills Desire for Improvement  ADL's:  Intact  Cognition:  WNL  Sleep:  Number of Hours: 7.75  COGNITIVE FEATURES THAT CONTRIBUTE TO RISK:  None    SUICIDE RISK:   Mild:  Suicidal ideation of limited frequency, intensity, duration, and specificity.  There are no identifiable plans, no associated intent, mild dysphoria and related symptoms, good self-control (both objective and subjective assessment), few other risk factors, and identifiable protective factors,  including available and accessible social support.   PLAN OF CARE: Patient will be admitted to inpatient psychiatric unit for stabilization and safety. Will provide and encourage milieu participation. Provide medication management and maked adjustments as needed.  Will follow daily.   I certify that inpatient services furnished can reasonably be expected to improve the patient's condition.  Neysa Hotter, MD 08/18/2016, 4:52 PM

## 2016-08-18 NOTE — Plan of Care (Signed)
Problem: Coping: Goal: Ability to cope will improve Outcome: Progressing Pt did not endorse sx of depression this evening and pt did not act as if he was depressed on the unit engaging with  peers and staff.

## 2016-08-18 NOTE — [facility] Group Notes (Signed)
[facility] Group Notes:  (Nursing/MHT/Case Management/Adjunct)  Date:  08/18/2016  Time:  10:58 AM  Type of Therapy:  Psychoeducational Skills  Participation Level:  Active  Participation Quality:  Appropriate  Affect:  Appropriate  Cognitive:  Appropriate  Insight:  Appropriate  Engagement in Group:  Engaged  Modes of Intervention:  Discussion  Summary of Progress/Problems: Pt did attend self inventory group.     Jacquelyne BalintForrest, Georgana Romain Shanta 08/18/2016, 10:58 AM

## 2016-08-18 NOTE — [facility] Group Notes (Signed)
[facility] Group Notes:  (Nursing/MHT/Case Management/Adjunct)  Date:  08/18/2016  Time:  2:33 PM  Type of Therapy:  Psychoeducational Skills  Participation Level:  Active  Participation Quality:  Appropriate  Affect:  Appropriate  Cognitive:  Appropriate  Insight:  Appropriate  Engagement in Group:  Engaged  Modes of Intervention:  Discussion  Summary of Progress/Problems: Pt did attend life skills group.     Jacquelyne BalintForrest, Adaia Matthies Shanta 08/18/2016, 2:33 PM

## 2016-08-19 DIAGNOSIS — F3162 Bipolar disorder, current episode mixed, moderate: Secondary | ICD-10-CM

## 2016-08-19 LAB — LIPID PANEL
CHOL/HDL RATIO: 5.9 ratio
CHOLESTEROL: 218 mg/dL — AB (ref 0–200)
HDL: 37 mg/dL — ABNORMAL LOW (ref >40)
LDL Cholesterol: 143 mg/dL — ABNORMAL HIGH (ref 0–99)
TRIGLYCERIDES: 192 mg/dL — AB (ref <150)
VLDL: 38 mg/dL (ref 0–40)

## 2016-08-19 LAB — TSH: TSH: 1.31 u[IU]/mL (ref 0.350–4.500)

## 2016-08-19 LAB — GLUCOSE, CAPILLARY: Glucose-Capillary: 226 mg/dL — ABNORMAL HIGH (ref 65–99)

## 2016-08-19 MED ORDER — ATORVASTATIN CALCIUM 40 MG PO TABS
40.0000 mg | ORAL_TABLET | Freq: Every day | ORAL | Status: DC
  Administered 2016-08-19 – 2016-08-26 (×8): 40 mg via ORAL
  Filled 2016-08-19 (×4): qty 1
  Filled 2016-08-19: qty 7
  Filled 2016-08-19 (×6): qty 1

## 2016-08-19 MED ORDER — ARIPIPRAZOLE 5 MG PO TABS: 5.0000 mg | ORAL_TABLET | Freq: Every day | ORAL | Status: DC

## 2016-08-19 MED ORDER — PROPRANOLOL HCL 10 MG PO TABS
10.0000 mg | ORAL_TABLET | Freq: Three times a day (TID) | ORAL | Status: DC
Start: 2016-08-19 — End: 2016-08-27
  Administered 2016-08-19 – 2016-08-27 (×24): 10 mg via ORAL
  Filled 2016-08-19 (×4): qty 1
  Filled 2016-08-19: qty 21
  Filled 2016-08-19 (×19): qty 1
  Filled 2016-08-19: qty 21
  Filled 2016-08-19 (×2): qty 1
  Filled 2016-08-19: qty 21
  Filled 2016-08-19 (×3): qty 1

## 2016-08-19 MED ORDER — ARIPIPRAZOLE 10 MG PO TABS
10.0000 mg | ORAL_TABLET | Freq: Every day | ORAL | Status: DC
  Administered 2016-08-20: 10 mg via ORAL
  Filled 2016-08-19 (×3): qty 1

## 2016-08-19 MED ORDER — GABAPENTIN 300 MG PO CAPS
600.0000 mg | ORAL_CAPSULE | Freq: Four times a day (QID) | ORAL | Status: DC
  Administered 2016-08-19 – 2016-08-27 (×32): 600 mg via ORAL
  Filled 2016-08-19 (×42): qty 2

## 2016-08-19 MED ORDER — HYDROXYZINE HCL 50 MG PO TABS
50.0000 mg | ORAL_TABLET | Freq: Four times a day (QID) | ORAL | Status: DC | PRN
  Administered 2016-08-19 – 2016-08-26 (×16): 50 mg via ORAL
  Filled 2016-08-19 (×14): qty 1
  Filled 2016-08-19: qty 10
  Filled 2016-08-19 (×2): qty 1

## 2016-08-19 NOTE — Plan of Care (Signed)
Problem: Education: Goal: Mental status will improve Outcome: Not Progressing Patient states he feels "like hitting something."  He is having difficult coping with his emotions today.

## 2016-08-19 NOTE — Progress Notes (Signed)
D: Pt denies SI/HI/AVH. Pt is pleasant and cooperative. Pt stated he was very emotional due to it being his child's birthday and not being able to communicate earlier.   A: Pt was offered support and encouragement. Pt was given scheduled medications. Pt was encourage to attend groups. Q 15 minute checks were done for safety.   R:Pt attends groups and interacts well with peers and staff. Pt is taking medication. Pt has no complaints.Pt receptive to treatment and safety maintained on unit.

## 2016-08-19 NOTE — Progress Notes (Signed)
Adult Psychoeducational Group Note  Date:  08/19/2016 Time:  9:32 PM  Group Topic/Focus:  Wrap-Up Group:   The focus of this group is to help patients review their daily goal of treatment and discuss progress on daily workbooks.   Participation Level:  Active  Participation Quality:  Appropriate  Affect:  Appropriate  Cognitive:  Alert, Appropriate and Oriented  Insight: Appropriate  Engagement in Group:  Engaged  Modes of Intervention:  Discussion  Additional Comments:  Patient attended wrap-up group and he said that his day was a 3.  His goal was working on getting his medications right and medical team is still working on it.  His coping skill is talking with others.  Jary Louvier W Alaynna Kerwood 08/19/2016, 9:32 PM

## 2016-08-19 NOTE — Progress Notes (Signed)
Patient ID: Mitchell George, male   DOB: [birth year], 40 y.o.   MRN: [id number] Notified NP of BP 168/78.  Patient has been placed on propranol for anxiety and elevated BP.  Will continue to assess.

## 2016-08-19 NOTE — Progress Notes (Signed)
[facility]  08/19/2016 1:47 PM Mitchell George  MRN:  [id number]  Subjective: Mitchell George reports, "I'm hurting emotionally today, I mean real bad. Today is my second daughter's birthday, but I have only seen her 6 times in 10 years. It got to me because I can't call her & wish her happy birthday. My mind is racing real bad. I have bad anxiety. I got up this morning feeling like punching the wall. I just feel bad. I don't even know the medicines that I'm taking?. Bad mood swings"  Principal Problem: Bipolar 1 disorder, depressed, severe Diagnosis:   Patient Active Problem List   Diagnosis Date Noted  . Bipolar 1 disorder, depressed, severe (HCC) [F31.4] 08/17/2016  . PTSD (post-traumatic stress disorder) [F43.10] 08/17/2016  . Affective psychosis, bipolar (HCC) [F31.9] 08/17/2016   Total Time spent with patient: 25 minutes  Past Psychiatric History: PTSD, Bipolar 1 disorder.  Past Medical History:  Past Medical History:  Diagnosis Date  . Bipolar 1 disorder (HCC)   . Diabetes mellitus   . PTSD (post-traumatic stress disorder)   . Schizophrenia [facility](HCC)     Past Surgical History:  Procedure Laterality Date  . HAND SURGERY    . KNEE SURGERY     Family History: History reviewed. No pertinent family history. Family Psychiatric  History: See H&P  Social History:  History  Alcohol Use  . Yes     History  Drug Use  . Types: Marijuana    Social History   Social History  . Marital status: Single    Spouse name: N/A  . Number of children: N/A  . Years of education: N/A   Social History Main Topics  . Smoking status: Current Every Day Smoker    Packs/day: 1.00  . Smokeless tobacco: Never Used  . Alcohol use Yes  . Drug use:     Types: Marijuana  . Sexual activity: Not Asked   Other Topics Concern  . None   Social History Narrative  . None   Additional Social History:    Sleep: Fair  Appetite:  Fair  Current Medications: Current Facility-Administered  Medications  Medication Dose Route Frequency Provider Last Rate Last Dose  . alum & mag hydroxide-simeth (MAALOX/MYLANTA) 200-200-20 MG/5ML suspension 30 mL  30 mL Oral PRN Charm RingsJamison Y Lord, NP      . Melene Muller[START ON 08/20/2016] ARIPiprazole (ABILIFY) tablet 10 mg  10 mg Oral Daily Sanjuana KavaAgnes I Kierstin January, NP      . atorvastatin (LIPITOR) tablet 40 mg  40 mg Oral q1800 Sanjuana KavaAgnes I Keshawn Sundberg, NP      . carbamazepine (TEGRETOL) tablet 200 mg  200 mg Oral BID PC Charm RingsJamison Y Lord, NP   200 mg at 08/19/16 0755  . gabapentin (NEURONTIN) capsule 600 mg  600 mg Oral QID Sanjuana KavaAgnes I Ellieanna Funderburg, NP      . hydrOXYzine (ATARAX/VISTARIL) tablet 50 mg  50 mg Oral Q6H PRN Sanjuana KavaAgnes I Kouper Spinella, NP      . ibuprofen (ADVIL,MOTRIN) tablet 800 mg  800 mg Oral Q6H PRN Sanjuana KavaAgnes I Joette Schmoker, NP   800 mg at 08/19/16 0754  . Influenza vac split quadrivalent PF (FLUARIX) injection 0.5 mL  0.5 mL Intramuscular Tomorrow-1000 Fernando A Cobos, MD      . magnesium hydroxide (MILK OF MAGNESIA) suspension 30 mL  30 mL Oral Daily PRN Charm RingsJamison Y Lord, NP      . metFORMIN (GLUCOPHAGE) tablet 500 mg  500 mg Oral BID WC Charm RingsJamison Y Lord, NP  500 mg at 08/19/16 0749  . methocarbamol (ROBAXIN) tablet 500 mg  500 mg Oral Q8H PRN Sanjuana Kava, NP   500 mg at 08/18/16 2231  . nicotine polacrilex (NICORETTE) gum 2 mg  2 mg Oral PRN Craige Cotta, MD   2 mg at 08/19/16 0755  . ondansetron (ZOFRAN) tablet 4 mg  4 mg Oral Q8H PRN Charm Rings, NP      . propranolol (INDERAL) tablet 10 mg  10 mg Oral TID Sanjuana Kava, NP      . traZODone (DESYREL) tablet 200 mg  200 mg Oral QHS Charm Rings, NP   200 mg at 08/18/16 2231   Lab Results:  Results for orders placed or performed during the hospital encounter of 08/17/16 (from the past 48 hour(s))  Glucose, capillary     Status: Abnormal   Collection Time: 08/17/16  6:18 PM  Result Value Ref Range   Glucose-Capillary 237 (H) 65 - 99 mg/dL  Glucose, capillary     Status: Abnormal   Collection Time: 08/18/16  6:29 AM  Result Value Ref  Range   Glucose-Capillary 193 (H) 65 - 99 mg/dL  Glucose, capillary     Status: Abnormal   Collection Time: 08/18/16  4:41 PM  Result Value Ref Range   Glucose-Capillary 312 (H) 65 - 99 mg/dL  Glucose, capillary     Status: Abnormal   Collection Time: 08/19/16  5:37 AM  Result Value Ref Range   Glucose-Capillary 226 (H) 65 - 99 mg/dL   Comment 1 Notify RN   Lipid panel     Status: Abnormal   Collection Time: 08/19/16  6:25 AM  Result Value Ref Range   Cholesterol 218 (H) 0 - 200 mg/dL   Triglycerides 161 (H) <150 mg/dL   HDL 37 (L) >09 mg/dL   Total CHOL/HDL Ratio 5.9 RATIO   VLDL 38 0 - 40 mg/dL   LDL Cholesterol 604 (H) 0 - 99 mg/dL    Comment:        Total Cholesterol/HDL:CHD Risk Coronary Heart Disease Risk Table                     Men   Women  1/2 Average Risk   3.4   3.3  Average Risk       5.0   4.4  2 X Average Risk   9.6   7.1  3 X Average Risk  23.4   11.0        Use the calculated Patient Ratio above and the CHD Risk Table to determine the patient's CHD Risk.        ATP III CLASSIFICATION (LDL):  <100     mg/dL   Optimal  540-981  mg/dL   Near or Above                    Optimal  130-159  mg/dL   Borderline  191-478  mg/dL   High  >295     mg/dL   Very High Performed at [facility]   TSH     Status: None   Collection Time: 08/19/16  6:25 AM  Result Value Ref Range   TSH 1.310 0.350 - 4.500 uIU/mL    Comment: Performed at [facility]    Blood Alcohol level:  Lab Results  Component Value Date   [facility] <5 08/16/2016   Metabolic Disorder Labs: No results found for: HGBA1C,  MPG No results found for: PROLACTIN Lab Results  Component Value Date   CHOL 218 (H) 08/19/2016   TRIG 192 (H) 08/19/2016   HDL 37 (L) 08/19/2016   CHOLHDL 5.9 08/19/2016   VLDL 38 08/19/2016   LDLCALC 143 (H) 08/19/2016   Physical Findings: AIMS: Facial and Oral Movements Muscles of Facial Expression: None, normal Lips and Perioral Area: None,  normal Jaw: None, normal Tongue: None, normal,Extremity Movements Upper (arms, wrists, hands, fingers): None, normal Lower (legs, knees, ankles, toes): None, normal, Trunk Movements Neck, shoulders, hips: None, normal, Overall Severity Severity of abnormal movements (highest score from questions above): None, normal Incapacitation due to abnormal movements: None, normal Patient's awareness of abnormal movements (rate only patient's report): No Awareness, Dental Status Current problems with teeth and/or dentures?: No Does patient usually wear dentures?: No  CIWA:    COWS:     Musculoskeletal: Strength & Muscle Tone: within normal limits Gait & Station: normal Patient leans: N/A  Psychiatric Specialty Exam: Physical Exam: See H&P  Review of Systems  Psychiatric/Behavioral: Positive for depression (increased tearfulness) and substance abuse (Hx, Cannabis use disorder). Negative for hallucinations, memory loss and suicidal ideas. The patient is nervous/anxious and has insomnia.     Blood pressure 127/84, pulse (!) 59, temperature 97.3 F (36.3 C), resp. rate 18, height 6\' 1"  (1.854 m), weight 104.3 kg (230 lb), SpO2 98 %.Body mass index is 30.34 kg/m.  General Appearance: Casual  Eye Contact:  Good  Speech:  Clear and Coherent  Volume:  Normal  Mood:  Depressed  Affect:  Restricted  Thought Process:  Coherent and Goal Directed  Orientation:  Full (Time, Place, and Person)  Thought Content:  Logical  Suicidal Thoughts:  No  Homicidal Thoughts:  No  Memory:  Immediate;   Fair Recent;   Fair Remote;   Fair  Judgement:  Fair  Insight:  Fair  Psychomotor Activity:  Normal  Concentration:  Concentration: Fair and Attention Span: Fair  Recall:  [employer] of Knowledge:  Fair  Language:  Fair  Akathisia:  No  Handed:  Right  AIMS (if indicated):     Assets:  Communication Skills Desire for Improvement  ADL's:  Intact  Cognition:  WNL  Sleep:  Number of Hours: 5.5      Treatment Plan Summary: Daily contact with patient to assess and evaluate symptoms and progress in treatment and Medication management: Bipolar disorder: Increased the Abilify to 10 mg daily. Mood instability: Will continue the Tegretol 200 mg bid. Agitation/pain: Will increase the Gabapentin to 600 mg Qid. High anxiety levels: Initiate Hydroxyzine 50 mg Q 6 hours prn. High anxiety: Initiate Propranolol 10 mg tid. Insomnia: Continue Trazodone 200 mg Q hs. - Continue 15 minutes observation for safety concerns - Encouraged to participate in milieu therapy and group therapy counseling sessions and also work with coping skills -  Develop treatment plan to decrease risk of relapse upon discharge and to reduce the need for readmission. -  Psycho-social education regarding relapse prevention and self care. - Health care follow up as needed for medical problems. - Restart home medications where appropriate: metformin 500 mg, Lipitor 40 mg. Obtaine: HGBA1C, Prolactin levels & TSH, result pending.  Armandina Stammer I, NP 08/19/2016, 1:47 PM

## 2016-08-19 NOTE — [facility] Group Notes (Signed)
Adult Therapy Group Note  Date: 08/19/2016 Time:  10:00-11:00AM  Group Topic/Focus: Healthy Event organiserupport Systems  Building Self Esteem:   The focus of this group was to assist patients in identifying current healthy supports, as well as how to widen their support systems.  Examples given by various patients were used to emphasize the importance of expanding supports, with an emphasis on the use of AA/NA, problem-specific support groups, doctors, counselors, and self.  Most patients also chose to share the reasons for their current hospitalization, and received much encouragement and support from their fellow patients as they did this.   Participation Level:  Active  Participation Quality:  Attentive and Sharing  Affect:  Blunted and Tearful  Cognitive:  Appropriate  Insight: Good  Engagement in Group:  Engaged  Modes of Intervention:  Discussion, Exploration and Support  Additional Comments:  The patient expressed that healthy supports currently active include his sister, best friend who is also his ex-girlfriend, and his current girlfriend.  He stated his mother is sick with cancer.  He thinks it is strange that he is getting support from an ex and current girlfriend.  He was given support and encouragement from others in group, even as he cried.   Lynnell ChadMareida J Grossman-Orr, LCSW 08/19/2016  4:15PM

## 2016-08-19 NOTE — Plan of Care (Signed)
Problem: Education: Goal: Emotional status will improve Outcome: Not Progressing Patient extremely tearful and anxious.  He states is "not having a good day."

## 2016-08-19 NOTE — Progress Notes (Signed)
Patient ID: Mitchell George, male   DOB: 09-10-1976, 40 y.o.   MRN: [id number] D: Patient is tearful and anxious.  He came to staff this morning stating, "I just realized it's my daughter's birthday today.  I was doing ok until I realized what the date was.  She turns 42 years old today.  I haven't seen her because her mother won't let me."  Patient also states he is having 10/10 on the pain scale.  He complains of severe pain in his feet and lower back.  Patient states, "Give me some ibuprofen, but I'll take some Roxys if you have them."  Explained to patient that he has ibuprofen ordered which was administered.  Patient also requested 2 pieces of nicorette gum which he was given 1 per order.  Patient denies any thoughts of self harm today.  Patient is sobbing and has sad affect, however, is talkative and joking at times.  He denies any HI/AVH.  Patient also states, "I really feel like punching the wall.  It would make me feel a lot better." A: Continue to monitor medication management and MD orders.  Safety checks completed every 15 minutes per protocol.  Offer support and encouragement as needed. R: Patient is receptive to staff; he is redirectable.

## 2016-08-20 DIAGNOSIS — Z79899 Other long term (current) drug therapy: Secondary | ICD-10-CM

## 2016-08-20 DIAGNOSIS — F17210 Nicotine dependence, cigarettes, uncomplicated: Secondary | ICD-10-CM

## 2016-08-20 DIAGNOSIS — F4310 Post-traumatic stress disorder, unspecified: Secondary | ICD-10-CM

## 2016-08-20 DIAGNOSIS — F1290 Cannabis use, unspecified, uncomplicated: Secondary | ICD-10-CM

## 2016-08-20 DIAGNOSIS — F3162 Bipolar disorder, current episode mixed, moderate: Principal | ICD-10-CM

## 2016-08-20 LAB — HEMOGLOBIN A1C
Hgb A1c MFr Bld: 11.2 % — ABNORMAL HIGH (ref 4.8–5.6)
MEAN PLASMA GLUCOSE: 275 mg/dL

## 2016-08-20 LAB — TSH: TSH: 1.416 u[IU]/mL (ref 0.350–4.500)

## 2016-08-20 LAB — GLUCOSE, CAPILLARY: GLUCOSE-CAPILLARY: 230 mg/dL — AB (ref 65–99)

## 2016-08-20 MED ORDER — LIDOCAINE 5 % EX PTCH
1.0000 | MEDICATED_PATCH | CUTANEOUS | Status: DC
  Filled 2016-08-20: qty 1

## 2016-08-20 MED ORDER — LORAZEPAM 0.5 MG PO TABS
0.5000 mg | ORAL_TABLET | Freq: Four times a day (QID) | ORAL | Status: DC | PRN
  Administered 2016-08-20 – 2016-08-22 (×6): 0.5 mg via ORAL
  Filled 2016-08-20 (×6): qty 1

## 2016-08-20 MED ORDER — ARIPIPRAZOLE 5 MG PO TABS: 5.0000 mg | ORAL_TABLET | Freq: Every day | ORAL | Status: DC

## 2016-08-20 MED ORDER — PANTOPRAZOLE SODIUM 20 MG PO TBEC
20.0000 mg | DELAYED_RELEASE_TABLET | Freq: Every day | ORAL | Status: DC
  Administered 2016-08-20 – 2016-08-22 (×3): 20 mg via ORAL
  Filled 2016-08-20 (×6): qty 1

## 2016-08-20 MED ORDER — QUETIAPINE FUMARATE 50 MG PO TABS
50.0000 mg | ORAL_TABLET | Freq: Every day | ORAL | Status: DC
  Administered 2016-08-20: 50 mg via ORAL
  Filled 2016-08-20 (×3): qty 1

## 2016-08-20 MED ORDER — TRAZODONE HCL 100 MG PO TABS
100.0000 mg | ORAL_TABLET | Freq: Every evening | ORAL | Status: DC | PRN
  Administered 2016-08-20: 100 mg via ORAL
  Filled 2016-08-20: qty 1

## 2016-08-20 MED ORDER — LIDOCAINE 5 % EX PTCH
1.0000 | MEDICATED_PATCH | CUTANEOUS | Status: DC
  Administered 2016-08-20 – 2016-08-21 (×2): 1 via TRANSDERMAL
  Filled 2016-08-20 (×4): qty 1

## 2016-08-20 MED ORDER — TRAZODONE HCL 100 MG PO TABS
100.0000 mg | ORAL_TABLET | Freq: Every day | ORAL | Status: DC
  Filled 2016-08-20: qty 1

## 2016-08-20 NOTE — Progress Notes (Signed)
Adult Psychoeducational Group Note  Date:  08/20/2016 Time:  1:30 PM  Overcoming Obstacles  Participation Level:  Active  Participation Quality:  Appropriate  Affect:  Appropriate  Cognitive:  Appropriate  Insight: Improving  Engagement in Group:  Developing/Improving  Modes of Intervention:  Discussion, Exploration and Support  Additional Comments: Today's Topic: Overcoming Obstacles. Patients identified one short term goal and potential obstacles in reaching this goal. Patients processed barriers involved in overcoming these obstacles. Patients identified steps necessary for overcoming these obstacles and explored motivation (internal and external) for facing these difficulties head on.  Pt listened attentively and affirmed contributions of others.  Stated "there's no way to change the past, just the future so go there."  Sallee Langenne C Takira Sherrin 08/20/2016, 5:44 PM

## 2016-08-20 NOTE — Progress Notes (Addendum)
[facility] MD Progress Note  08/20/2016 4:47 PM Mitchell George  MRN:  [id number]  Subjective: patient states he is having a difficult day, partly because he states his ex wife, with whom he has a poor relationship, was trying to contact his current girlfriend presumably to find out where he was . States he has been feeling subjectively anxious and agitated . Denies medication side effects. Objective: I have discussed case with treatment team and have met with patient . He is a 40 year old male, reports history of mood disorder, has been diagnosed with Bipolar Disorder, presented due to worsening depression, anxiety . He also reports a history of mood instability and angry outbursts, explosiveness . At this time on Tegretol, which he is tolerating well thus far . Patient is also on Ablify, but does not feel this medication is helping much . He complains of insomnia, and states that last night he got less than one hour of uninterrupted sleep. Visible on unit , going to groups, no overtly disruptive behavior -  vaguely restless, irritable.  Principal Problem: Bipolar 1 disorder, depressed, severe Diagnosis:   Patient Active Problem List   Diagnosis Date Noted  . Bipolar disorder, current episode mixed, moderate ([place]) [F31.62]   . Bipolar 1 disorder, depressed, severe ([place]) [F31.4] 08/17/2016  . PTSD (post-traumatic stress disorder) [F43.10] 08/17/2016  . Affective psychosis, bipolar ([place]) [F31.9] 08/17/2016   Total Time spent with patient: 25 minutes  Past Psychiatric History: PTSD, Bipolar 1 disorder.  Past Medical History:  Past Medical History:  Diagnosis Date  . Bipolar 1 disorder ([place])   . Diabetes mellitus   . PTSD (post-traumatic stress disorder)   . Schizophrenia [facility])     Past Surgical History:  Procedure Laterality Date  . HAND SURGERY    . KNEE SURGERY     Family History: History reviewed. No pertinent family history. Family Psychiatric  History: See H&P  Social History:   History  Alcohol Use  . Yes     History  Drug Use  . Types: Marijuana    Social History   Social History  . Marital status: Single    Spouse name: N/A  . Number of children: N/A  . Years of education: N/A   Social History Main Topics  . Smoking status: Current Every Day Smoker    Packs/day: 1.00  . Smokeless tobacco: Never Used  . Alcohol use Yes  . Drug use:     Types: Marijuana  . Sexual activity: Not Asked   Other Topics Concern  . None   Social History Narrative  . None   Additional Social History:    Sleep: poor   Appetite:  Fair  Current Medications: Current Facility-Administered Medications  Medication Dose Route Frequency Provider Last Rate Last Dose  . alum & mag hydroxide-simeth (MAALOX/MYLANTA) 200-200-20 MG/5ML suspension 30 mL  30 mL Oral PRN Patrecia Pour, NP   30 mL at 08/20/16 0049  . atorvastatin (LIPITOR) tablet 40 mg  40 mg Oral q1800 Encarnacion Slates, NP   40 mg at 08/19/16 1640  . carbamazepine (TEGRETOL) tablet 200 mg  200 mg Oral BID PC Patrecia Pour, NP   200 mg at 08/20/16 0814  . gabapentin (NEURONTIN) capsule 600 mg  600 mg Oral QID Encarnacion Slates, NP   600 mg at 08/20/16 1139  . hydrOXYzine (ATARAX/VISTARIL) tablet 50 mg  50 mg Oral Q6H PRN Encarnacion Slates, NP   50 mg at 08/20/16 1347  .  ibuprofen (ADVIL,MOTRIN) tablet 800 mg  800 mg Oral Q6H PRN Encarnacion Slates, NP   800 mg at 08/20/16 1605  . Influenza vac split quadrivalent PF (FLUARIX) injection 0.5 mL  0.5 mL Intramuscular Tomorrow-1000 Fernando A Cobos, MD      . lidocaine (LIDODERM) 5 % 1 patch  1 patch Transdermal Q24H Myer Peer Cobos, MD      . LORazepam (ATIVAN) tablet 0.5 mg  0.5 mg Oral Q6H PRN Jenne Campus, MD   0.5 mg at 08/20/16 1605  . magnesium hydroxide (MILK OF MAGNESIA) suspension 30 mL  30 mL Oral Daily PRN Patrecia Pour, NP      . metFORMIN (GLUCOPHAGE) tablet 500 mg  500 mg Oral BID WC Patrecia Pour, NP   500 mg at 08/20/16 0814  . methocarbamol (ROBAXIN) tablet  500 mg  500 mg Oral Q8H PRN Encarnacion Slates, NP   500 mg at 08/20/16 1605  . nicotine polacrilex (NICORETTE) gum 2 mg  2 mg Oral PRN Jenne Campus, MD   2 mg at 08/20/16 1605  . ondansetron (ZOFRAN) tablet 4 mg  4 mg Oral Q8H PRN Patrecia Pour, NP      . pantoprazole (PROTONIX) EC tablet 20 mg  20 mg Oral Daily Jenne Campus, MD   20 mg at 08/20/16 1605  . propranolol (INDERAL) tablet 10 mg  10 mg Oral TID Encarnacion Slates, NP   10 mg at 08/20/16 1139  . QUEtiapine (SEROQUEL) tablet 50 mg  50 mg Oral QHS Jenne Campus, MD      . traZODone (DESYREL) tablet 100 mg  100 mg Oral QHS Jenne Campus, MD       Lab Results:  Results for orders placed or performed during the hospital encounter of 08/17/16 (from the past 48 hour(s))  Glucose, capillary     Status: Abnormal   Collection Time: 08/19/16  5:37 AM  Result Value Ref Range   Glucose-Capillary 226 (H) 65 - 99 mg/dL   Comment 1 Notify RN   Hemoglobin A1c     Status: Abnormal   Collection Time: 08/19/16  6:25 AM  Result Value Ref Range   Hgb A1c MFr Bld 11.2 (H) 4.8 - 5.6 %    Comment: (NOTE)         Pre-diabetes: 5.7 - 6.4         Diabetes: >6.4         Glycemic control for adults with diabetes: <7.0    Mean Plasma Glucose 275 mg/dL    Comment: (NOTE) Performed At: [facility] [place], [place] [id number] Lindon Romp MD JW:[id number] Performed at [facility]   Lipid panel     Status: Abnormal   Collection Time: 08/19/16  6:25 AM  Result Value Ref Range   Cholesterol 218 (H) 0 - 200 mg/dL   Triglycerides 192 (H) <150 mg/dL   HDL 37 (L) >40 mg/dL   Total CHOL/HDL Ratio 5.9 RATIO   VLDL 38 0 - 40 mg/dL   LDL Cholesterol 143 (H) 0 - 99 mg/dL    Comment:        Total Cholesterol/HDL:CHD Risk Coronary Heart Disease Risk Table                     Men   Women  1/2 Average Risk   3.4   3.3  Average Risk  5.0   4.4  2 X Average Risk   9.6   7.1  3 X Average Risk  23.4    11.0        Use the calculated Patient Ratio above and the CHD Risk Table to determine the patient's CHD Risk.        ATP III CLASSIFICATION (LDL):  <100     mg/dL   Optimal  100-129  mg/dL   Near or Above                    Optimal  130-159  mg/dL   Borderline  160-189  mg/dL   High  >190     mg/dL   Very High Performed at [facility]   TSH     Status: None   Collection Time: 08/19/16  6:25 AM  Result Value Ref Range   TSH 1.310 0.350 - 4.500 uIU/mL    Comment: Performed at [facility]  Glucose, capillary     Status: Abnormal   Collection Time: 08/20/16  5:41 AM  Result Value Ref Range   Glucose-Capillary 230 (H) 65 - 99 mg/dL   Comment 1 Notify RN   TSH     Status: None   Collection Time: 08/20/16  6:25 AM  Result Value Ref Range   TSH 1.416 0.350 - 4.500 uIU/mL    Comment: Performed at [facility]    Blood Alcohol level:  Lab Results  Component Value Date   [facility] <5 44/81/8563   Metabolic Disorder Labs: Lab Results  Component Value Date   HGBA1C 11.2 (H) 08/19/2016   MPG 275 08/19/2016   No results found for: PROLACTIN Lab Results  Component Value Date   CHOL 218 (H) 08/19/2016   TRIG 192 (H) 08/19/2016   HDL 37 (L) 08/19/2016   CHOLHDL 5.9 08/19/2016   VLDL 38 08/19/2016   LDLCALC 143 (H) 08/19/2016   Physical Findings: AIMS: Facial and Oral Movements Muscles of Facial Expression: None, normal Lips and Perioral Area: None, normal Jaw: None, normal Tongue: None, normal,Extremity Movements Upper (arms, wrists, hands, fingers): None, normal Lower (legs, knees, ankles, toes): None, normal, Trunk Movements Neck, shoulders, hips: None, normal, Overall Severity Severity of abnormal movements (highest score from questions above): None, normal Incapacitation due to abnormal movements: None, normal Patient's awareness of abnormal movements (rate only patient's report): No Awareness, Dental Status Current problems  with teeth and/or dentures?: No Does patient usually wear dentures?: No  CIWA:    COWS:     Musculoskeletal: Strength & Muscle Tone: within normal limits Gait & Station: normal Patient leans: N/A  Psychiatric Specialty Exam: Physical Exam: See H&P  Review of Systems  Psychiatric/Behavioral: Positive for depression (increased tearfulness) and substance abuse (Hx, Cannabis use disorder). Negative for hallucinations, memory loss and suicidal ideas. The patient is nervous/anxious and has insomnia.     Blood pressure 133/80, pulse 63, temperature 98.2 F (36.8 C), temperature source Oral, resp. rate 16, height 6' 1"  (1.854 m), weight 230 lb (104.3 kg), SpO2 98 %.Body mass index is 30.34 kg/m.  General Appearance:  fairly groomed   Eye Contact:  Good  Speech:  Clear and Coherent  Volume:  Normal  Mood:  depressed  Affect:  labile, presents vaguely irritable, although improves as session progresses   Thought Process:  Linear   Orientation:  Full (Time, Place, and Person)  Thought Content:  linear   Suicidal Thoughts:  No- denies current  suicidal ideations, contracts for safety on unit   Homicidal Thoughts: made statement earlier he would kill his ex-wife if she came to see him, but at this time denies any actual plan or intention of violence or homicidal plan or intention   Memory:  recent and remote grossly intact   Judgement:  Fair  Insight:  Fair  Psychomotor Activity:  slightly restless   Concentration:  Concentration: good  and Attention Span: good   Recall: good   Fund of Knowledge:  good  Language:  good  Akathisia:  No  Handed:  Right  AIMS (if indicated):     Assets:  Communication Skills Desire for Improvement  ADL's:  Intact  Cognition:  WNL  Sleep:  Number of Hours: 5.5      Assessment - patient reports history of mood disorder, mood instability, explosive angry outbursts, and a prior diagnosis of Bipolar Disorder. Today states feeling more irritable due to  finding out that his ex wife with whom he has a poor relationship was trying to find out where he was . Although vaguely restless, he is not threatening or overtly agitated at this time . Improves with support . Complains of insomnia as a major symptom.  Treatment Plan Summary: Daily contact with patient to assess and evaluate symptoms and progress in treatment and Medication management: Bipolar disorder: D/C Abilify, due to concern that it might be contributing to some akathisia , and to start Seroquel , as an option to better address insomnia and night time racing thoughts  Mood instability: Will continue  Tegretol 200 mg BID Agitation/pain: Will continue   Gabapentin to 600 mg QID. Will also start Lidoderm patch to help address chronic back pain High anxiety levels:Start Ativan 0.5 mgrs Q 6 hours PRN for anxiety as needed   High anxiety: Continue  Propranolol 10 mg tid. Insomnia: Decrease Trazodone to 100 mgrs QHS PRN for insomnia  Treatment team working on disposition planning options  * Patient reports history of GERD symptoms for which he was taking Protonix at home, without side effects- restart Protonix 20 mgrs Hubert Azure, MD 08/20/2016, 4:47 PM

## 2016-08-20 NOTE — Progress Notes (Signed)
Recreation Therapy Notes  Date: 08/20/16  Time: 0930 Location: 300 [address] Dayroom  Group Topic: Stress Management  Goal Area(s) Addresses:  Patient will verbalize importance of using healthy stress management.  Patient will identify positive emotions associated with healthy stress management.   Intervention: Guided Imagery  Activity :  Peaceful Place Imagery.  LRT introduced to the technique of guided imagery to patients.  LRT read a script so patients could participate in the technique.  Patients were to follow along as LRT read script.  Education:  Stress Management, Discharge Planning.   Education Outcome: Acknowledges edcuation/In group clarification offered/Needs additional education  Clinical Observations/Feedback: Pt did not attend group.      Melida Northington, LRT/CTRS         Undrea Shipes A 08/20/2016 11:55 AM 

## 2016-08-20 NOTE — Progress Notes (Signed)
D: Pt denies SI/HI/AVH. Pt is labile on the unit but is redirectable. Pt continues to have crying spells one minute then angry at the world the next. Pt interacting with peers and staff appropriately. Pt stated he had an "off and on day " today.   A: Pt was offered support and encouragement. Pt was given scheduled medications. Pt was encourage to attend groups. Q 15 minute checks were done for safety.   R:Pt attends groups and interacts well with peers and staff. Pt is taking medication. Pt has no complaints.Pt receptive to treatment and safety maintained on unit.

## 2016-08-20 NOTE — Progress Notes (Signed)
D: Patient up and visible in the milieu. Spoke with patient 1:1. Rates sleep poor, appetite fair, energy low and concentration poor. Patient's affect labile, agitated, angry and tearful with congruent mood. Rating her depression at an 8/10, hopelessness at an 8/10 and anxiety at a 9-10/10. States goal for today is to "talk to the doctor." "I've got to have more sleep and more relief from my anxiety."  Complaining of chronic back pain and bilat food pain of a 10/10. Patient pacing, threatening to "kill my wife if she comes through those doors." "I'm about to go off."   A: MD and SW made aware of patient's statements. Prn requested as patient currently has none that can be given. Medicated per orders, robaxin, vistaril and ibuprofen prn given. Emotional support offered and self inventory reviewed. Encouraged patient stay off the phone and remain focused on self. Reassured patient a change in code number is an option as well notifying reception, wife may not visit.   R: On reassess, patient's disposition unchanged as are complaints of pain. He remains tearful awaiting time with doctor to discuss concerns. Patient denies SI/HI toward people on unit and remains safe on level III obs.

## 2016-08-20 NOTE — Plan of Care (Signed)
Problem: Safety: Goal: Periods of time without injury will increase Outcome: Progressing Patient has not engaged in self harm.  Problem: Medication: Goal: Compliance with prescribed medication regimen will improve Outcome: Progressing Patient is med compliant.   

## 2016-08-20 NOTE — BHH Group Notes (Signed)
Adult Psychoeducational Group Note  Date:  08/20/2016 Time:  1:30 PM  Overcoming Obstacles  Participation Level:  Active  Participation Quality:  Appropriate  Affect:  Appropriate  Cognitive:  Appropriate  Insight: Improving  Engagement in Group:  Developing/Improving  Modes of Intervention:  Discussion, Exploration and Support  Additional Comments: Today's Topic: Overcoming Obstacles. Patients identified one short term goal and potential obstacles in reaching this goal. Patients processed barriers involved in overcoming these obstacles. Patients identified steps necessary for overcoming these obstacles and explored motivation (internal and external) for facing these difficulties head on. Pt was active and supportive of others, was able to state his desire to "make things happen" "both good and bad" w others.     Sallee Langenne C Seanne Chirico 08/20/2016, 5:44 PM

## 2016-08-20 NOTE — Tx Team (Signed)
Interdisciplinary Treatment and Diagnostic Plan Update  08/20/2016 Time of Session: 10:29 AM  Mitchell George MRN: [id number]  Principal Diagnosis: Bipolar 1 Disorder  Secondary Diagnoses: Active Problems:   Affective psychosis, bipolar (HCC)   Bipolar disorder, current episode mixed, moderate (HCC)   Current Medications:  Current Facility-Administered Medications  Medication Dose Route Frequency Provider Last Rate Last Dose  . alum & mag hydroxide-simeth (MAALOX/MYLANTA) 200-200-20 MG/5ML suspension 30 mL  30 mL Oral PRN Patrecia Pour, NP   30 mL at 08/20/16 0049  . ARIPiprazole (ABILIFY) tablet 10 mg  10 mg Oral Daily Encarnacion Slates, NP   10 mg at 08/20/16 0814  . atorvastatin (LIPITOR) tablet 40 mg  40 mg Oral q1800 Encarnacion Slates, NP   40 mg at 08/19/16 1640  . carbamazepine (TEGRETOL) tablet 200 mg  200 mg Oral BID PC Patrecia Pour, NP   200 mg at 08/20/16 0814  . gabapentin (NEURONTIN) capsule 600 mg  600 mg Oral QID Encarnacion Slates, NP   600 mg at 08/20/16 0814  . hydrOXYzine (ATARAX/VISTARIL) tablet 50 mg  50 mg Oral Q6H PRN Encarnacion Slates, NP   50 mg at 08/20/16 0818  . ibuprofen (ADVIL,MOTRIN) tablet 800 mg  800 mg Oral Q6H PRN Encarnacion Slates, NP   800 mg at 08/20/16 0818  . Influenza vac split quadrivalent PF (FLUARIX) injection 0.5 mL  0.5 mL Intramuscular Tomorrow-1000 Fernando A Cobos, MD      . magnesium hydroxide (MILK OF MAGNESIA) suspension 30 mL  30 mL Oral Daily PRN Patrecia Pour, NP      . metFORMIN (GLUCOPHAGE) tablet 500 mg  500 mg Oral BID WC Patrecia Pour, NP   500 mg at 08/20/16 0814  . methocarbamol (ROBAXIN) tablet 500 mg  500 mg Oral Q8H PRN Encarnacion Slates, NP   500 mg at 08/20/16 0818  . nicotine polacrilex (NICORETTE) gum 2 mg  2 mg Oral PRN Jenne Campus, MD   2 mg at 08/20/16 0818  . ondansetron (ZOFRAN) tablet 4 mg  4 mg Oral Q8H PRN Patrecia Pour, NP      . propranolol (INDERAL) tablet 10 mg  10 mg Oral TID Encarnacion Slates, NP   10 mg at 08/20/16 0814  .  traZODone (DESYREL) tablet 200 mg  200 mg Oral QHS Patrecia Pour, NP   200 mg at 08/19/16 2224   PTA Medications: Prescriptions Prior to Admission  Medication Sig Dispense Refill Last Dose  . diclofenac (VOLTAREN) 75 MG EC tablet TAKE 1 TABLET BY MOUTH TWICE A DAY (Patient not taking: Reported on 08/16/2016) 120 tablet 2 Not Taking at Unknown time  . diclofenac (VOLTAREN) 75 MG EC tablet TAKE 1 TABLET BY MOUTH TWICE A DAY (Patient not taking: Reported on 08/16/2016) 60 tablet 2 Not Taking at Unknown time  . ibuprofen (ADVIL,MOTRIN) 600 MG tablet Take 1 tablet (600 mg total) by mouth every 6 (six) hours as needed. (Patient not taking: Reported on 08/16/2016) 30 tablet 0 Not Taking at Unknown time  . methocarbamol (ROBAXIN) 500 MG tablet Take 2 tablets (1,000 mg total) by mouth 4 (four) times daily as needed (Pain). (Patient not taking: Reported on 08/16/2016) 20 tablet 0 Not Taking at Unknown time  . sulfamethoxazole-trimethoprim (SEPTRA DS) 800-160 MG per tablet Take 1 tablet by mouth every 12 (twelve) hours. (Patient not taking: Reported on 08/16/2016) 14 tablet 0 Not Taking at Unknown time  .  traZODone (DESYREL) 150 MG tablet Take 150-300 mg by mouth at bedtime.   Past Week at Unknown time    Patient Stressors: Financial difficulties Health problems Medication change or noncompliance Substance abuse  Patient Strengths: Average or above average intelligence Communication skills Supportive family/friends  Treatment Modalities: Medication Management, Group therapy, Case management,  1 to 1 session with clinician, Psychoeducation, Recreational therapy.   Physician Treatment Plan for Primary Diagnosis: <principal problem not specified> Long Term Goal(s): Improvement in symptoms so as ready for discharge Improvement in symptoms so as ready for discharge   Short Term Goals: Ability to verbalize feelings will improve Ability to disclose and discuss suicidal ideas Ability to identify and develop  effective coping behaviors will improve Ability to identify triggers associated with substance abuse/mental health issues will improve Ability to identify changes in lifestyle to reduce recurrence of condition will improve Ability to verbalize feelings will improve Compliance with prescribed medications will improve  Medication Management: Evaluate patient's response, side effects, and tolerance of medication regimen.  Therapeutic Interventions: 1 to 1 sessions, Unit Group sessions and Medication administration.  Evaluation of Outcomes: Progressing  Physician Treatment Plan for Secondary Diagnosis: Active Problems:   Affective psychosis, bipolar ([place])   Bipolar disorder, current episode mixed, moderate ([place])  Long Term Goal(s): Improvement in symptoms so as ready for discharge Improvement in symptoms so as ready for discharge   Short Term Goals: Ability to verbalize feelings will improve Ability to disclose and discuss suicidal ideas Ability to identify and develop effective coping behaviors will improve Ability to identify triggers associated with substance abuse/mental health issues will improve Ability to identify changes in lifestyle to reduce recurrence of condition will improve Ability to verbalize feelings will improve Compliance with prescribed medications will improve     Medication Management: Evaluate patient's response, side effects, and tolerance of medication regimen.  Therapeutic Interventions: 1 to 1 sessions, Unit Group sessions and Medication administration.  Evaluation of Outcomes:    RN Treatment Plan for Primary Diagnosis: Bipolar 1 Disorder Long Term Goal(s): Knowledge of disease and therapeutic regimen to maintain health will improve  Short Term Goals: Ability to verbalize frustration and anger appropriately will improve and Ability to demonstrate self-control  Medication Management: RN will administer medications as ordered by provider, will assess and  evaluate patient's response and provide education to patient for prescribed medication. RN will report any adverse and/or side effects to prescribing provider.  Therapeutic Interventions: 1 on 1 counseling sessions, Psychoeducation, Medication administration, Evaluate responses to treatment, Monitor vital signs and CBGs as ordered, Perform/monitor CIWA, COWS, AIMS and Fall Risk screenings as ordered, Perform wound care treatments as ordered.  Evaluation of Outcomes: Not Progressing   LCSW Treatment Plan for Primary Diagnosis: Bipolar 1 Disorder  Long Term Goal(s): Safe transition to appropriate next level of care at discharge, Engage patient in therapeutic group addressing interpersonal concerns.  Short Term Goals: Engage patient in aftercare planning with referrals and resources, Increase emotional regulation and Identify triggers associated with mental health/substance abuse issues  Therapeutic Interventions: Assess for all discharge needs, 1 to 1 time with Social worker, Explore available resources and support systems, Assess for adequacy in community support network, Educate family and significant other(s) on suicide prevention, Complete Psychosocial Assessment, Interpersonal group therapy.  Evaluation of Outcomes: Not Met   Progress in Treatment: Attending groups: Yes. Participating in groups: Yes. Taking medication as prescribed: Yes. Toleration medication: Yes. Family/Significant other contact made: No, will contact:  collaterals w patient permission Patient understands  diagnosis: Yes. Discussing patient identified problems/goals with staff: Yes. Medical problems stabilized or resolved: Yes. Denies suicidal/homicidal ideation: No. and As evidenced by:  frequent expressions of anger towards others, including ex wife.  States he has "multiple personalities" and "flips" when angry Issues/concerns per patient self-inventory: No. Other: NA  New problem(s) identified: Yes, Describe:   anger management, medication stabilization  New Short Term/Long Term Goal(s):  Discharge Plan or Barriers:   Reason for Continuation of Hospitalization: Aggression Depression Medication stabilization  Estimated Length of Stay: 3 - 5 days  Attendees: Patient: 08/20/2016 10:29 AM  Physician: Gabriel Earing MD 08/20/2016 10:29 AM  Nursing: Seymour Bars RN 08/20/2016 10:29 AM  RN Care Manager: Addison Naegeli RN CM 08/20/2016 10:29 AM  Social Worker: Eusebio Me LCSW 08/20/2016 10:29 AM  Recreational Therapist:  08/20/2016 10:29 AM  Other:  08/20/2016 10:29 AM  Other:  08/20/2016 10:29 AM  Other: 08/20/2016 10:29 AM    Scribe for Treatment Team: Beverely Pace, LCSW 08/20/2016 10:29 AM

## 2016-08-20 NOTE — [facility] Group Notes (Signed)
[facility]   08/20/2016 8:45 AM  Participation Quality:    Mood/Affect:  Appropriate  Plan for Discharge/Comments:  [facility]  Transportation Means: UTA at this time  Supports: conflict within family, significant anger towards others, will need to be assessed further  Sallee LangeAnne C Lorence Nagengast
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
## 2016-08-21 LAB — HEMOGLOBIN A1C
HEMOGLOBIN A1C: 10.7 % — AB (ref 4.8–5.6)
Mean Plasma Glucose: 260 mg/dL

## 2016-08-21 LAB — PROLACTIN: PROLACTIN: 10.7 ng/mL (ref 4.0–15.2)

## 2016-08-21 LAB — GLUCOSE, CAPILLARY: Glucose-Capillary: 207 mg/dL — ABNORMAL HIGH (ref 65–99)

## 2016-08-21 MED ORDER — METHOCARBAMOL 500 MG PO TABS
500.0000 mg | ORAL_TABLET | Freq: Four times a day (QID) | ORAL | Status: DC | PRN
  Administered 2016-08-22 – 2016-08-25 (×12): 500 mg via ORAL
  Filled 2016-08-21 (×13): qty 1

## 2016-08-21 MED ORDER — LIDOCAINE 5 % EX PTCH
1.0000 | MEDICATED_PATCH | CUTANEOUS | Status: DC
  Administered 2016-08-22 – 2016-08-27 (×6): 1 via TRANSDERMAL
  Filled 2016-08-21 (×8): qty 1

## 2016-08-21 MED ORDER — QUETIAPINE FUMARATE 50 MG PO TABS
150.0000 mg | ORAL_TABLET | Freq: Every day | ORAL | Status: DC
  Administered 2016-08-21: 150 mg via ORAL
  Filled 2016-08-21 (×4): qty 3

## 2016-08-21 MED ORDER — TRAZODONE HCL 50 MG PO TABS
50.0000 mg | ORAL_TABLET | Freq: Every evening | ORAL | Status: DC | PRN
  Administered 2016-08-21 – 2016-08-25 (×5): 50 mg via ORAL
  Filled 2016-08-21 (×4): qty 1
  Filled 2016-08-21: qty 7
  Filled 2016-08-21: qty 1

## 2016-08-21 MED ORDER — QUETIAPINE FUMARATE 100 MG PO TABS
100.0000 mg | ORAL_TABLET | Freq: Every day | ORAL | Status: DC
  Filled 2016-08-21 (×2): qty 1

## 2016-08-21 MED ORDER — QUETIAPINE FUMARATE 25 MG PO TABS
25.0000 mg | ORAL_TABLET | Freq: Two times a day (BID) | ORAL | Status: DC
  Administered 2016-08-21 – 2016-08-22 (×2): 25 mg via ORAL
  Filled 2016-08-21 (×7): qty 1

## 2016-08-21 NOTE — Progress Notes (Signed)
Is goal is Patient ID: Mitchell RuizJames O Odenthal, male   DOB: May 28, 1976, 40 y.o.   MRN: [id number] D: Patient continues to express severe anxiety and pain.  Patient complains of dizziness and headaches.  Patient rates her depression as a 3; hopelessness as a 3; anxiety as a 9.  Patient is frequently attention seeking from staff.  Patient will be observed in day room joking and laughing with staff. When patient presents to staff, he comes sullen and complains of severe anxiety.  He denies any thoughts of self harm. Patient's goal is "to figure out what I got to do to get home."   A: Continue to monitor medication management and MD orders.  Safety checks completed every 15 minutes per protocol.  Offer support and encouragement as needed. R: Patient is receptive to staff; his behavior has been appropriate.

## 2016-08-21 NOTE — Progress Notes (Signed)
[facility] MD Progress Note  08/21/2016 12:28 PM Mitchell George  MRN:  [id number]  Subjective: Patient states he continues to experience significant symptoms - he states he feels emotionally labile, and endorses a subjective sense of racing thoughts .  Objective: I have discussed case with treatment team and have met with patient .  Patient continues to report symptoms suggestive of a mixed affective episode- feels depressed, sad, but at the same time endorses restlessness, a sense of irritability, racing thoughts. He ruminates about his ex wife and children- states he is very angry with his ex-wife because she has kept him from being able to visit his children ( who live with her) except for only occasionally. He does not endorse homicidal ideations towards her, states he has no intention of hurting her or finding her , states " I want to avoid her . "  He does not endorse medication side effects, and does state he slept better on Seroquel. As per staff, patient remains labile, tearful at times, vaguely irritable, but overall behavior is in good control and he is redirectable .  Principal Problem: Bipolar 1 disorder, depressed, severe Diagnosis:   Patient Active Problem List   Diagnosis Date Noted  . Bipolar disorder, current episode mixed, moderate ([place]) [F31.62]   . Bipolar 1 disorder, depressed, severe ([place]) [F31.4] 08/17/2016  . PTSD (post-traumatic stress disorder) [F43.10] 08/17/2016  . Affective psychosis, bipolar ([place]) [F31.9] 08/17/2016   Total Time spent with patient: 20 minutes   Past Psychiatric History: PTSD, Bipolar 1 disorder.  Past Medical History:  Past Medical History:  Diagnosis Date  . Bipolar 1 disorder ([place])   . Diabetes mellitus   . PTSD (post-traumatic stress disorder)   . Schizophrenia [facility])     Past Surgical History:  Procedure Laterality Date  . HAND SURGERY    . KNEE SURGERY     Family History: History reviewed. No pertinent family history. Family  Psychiatric  History: See H&P  Social History:  History  Alcohol Use  . Yes     History  Drug Use  . Types: Marijuana    Social History   Social History  . Marital status: Single    Spouse name: N/A  . Number of children: N/A  . Years of education: N/A   Social History Main Topics  . Smoking status: Current Every Day Smoker    Packs/day: 1.00  . Smokeless tobacco: Never Used  . Alcohol use Yes  . Drug use:     Types: Marijuana  . Sexual activity: Not Asked   Other Topics Concern  . None   Social History Narrative  . None   Additional Social History:    Sleep: improving   Appetite:  Fair  Current Medications: Current Facility-Administered Medications  Medication Dose Route Frequency Provider Last Rate Last Dose  . alum & mag hydroxide-simeth (MAALOX/MYLANTA) 200-200-20 MG/5ML suspension 30 mL  30 mL Oral PRN Patrecia Pour, NP   30 mL at 08/20/16 0049  . atorvastatin (LIPITOR) tablet 40 mg  40 mg Oral q1800 Encarnacion Slates, NP   40 mg at 08/20/16 1818  . carbamazepine (TEGRETOL) tablet 200 mg  200 mg Oral BID PC Patrecia Pour, NP   200 mg at 08/21/16 0756  . gabapentin (NEURONTIN) capsule 600 mg  600 mg Oral QID Encarnacion Slates, NP   600 mg at 08/21/16 1122  . hydrOXYzine (ATARAX/VISTARIL) tablet 50 mg  50 mg Oral Q6H PRN Encarnacion Slates,  NP   50 mg at 08/21/16 0818  . ibuprofen (ADVIL,MOTRIN) tablet 800 mg  800 mg Oral Q6H PRN Encarnacion Slates, NP   800 mg at 08/21/16 0818  . Influenza vac split quadrivalent PF (FLUARIX) injection 0.5 mL  0.5 mL Intramuscular Tomorrow-1000 Stelios Kirby A Carnisha Feltz, MD      . lidocaine (LIDODERM) 5 % 1 patch  1 patch Transdermal Q24H Jenne Campus, MD   1 patch at 08/20/16 1820  . LORazepam (ATIVAN) tablet 0.5 mg  0.5 mg Oral Q6H PRN Jenne Campus, MD   0.5 mg at 08/20/16 2206  . magnesium hydroxide (MILK OF MAGNESIA) suspension 30 mL  30 mL Oral Daily PRN Patrecia Pour, NP      . metFORMIN (GLUCOPHAGE) tablet 500 mg  500 mg Oral BID WC  Patrecia Pour, NP   500 mg at 08/21/16 0756  . methocarbamol (ROBAXIN) tablet 500 mg  500 mg Oral Q8H PRN Encarnacion Slates, NP   500 mg at 08/21/16 0818  . nicotine polacrilex (NICORETTE) gum 2 mg  2 mg Oral PRN Jenne Campus, MD   2 mg at 08/20/16 1820  . ondansetron (ZOFRAN) tablet 4 mg  4 mg Oral Q8H PRN Patrecia Pour, NP      . pantoprazole (PROTONIX) EC tablet 20 mg  20 mg Oral Daily Jenne Campus, MD   20 mg at 08/21/16 0757  . propranolol (INDERAL) tablet 10 mg  10 mg Oral TID Encarnacion Slates, NP   10 mg at 08/21/16 1122  . QUEtiapine (SEROQUEL) tablet 100 mg  100 mg Oral QHS Myer Peer Denae Zulueta, MD      . QUEtiapine (SEROQUEL) tablet 25 mg  25 mg Oral BID Jenne Campus, MD      . traZODone (DESYREL) tablet 100 mg  100 mg Oral QHS PRN Jenne Campus, MD   100 mg at 08/20/16 2205   Lab Results:  Results for orders placed or performed during the hospital encounter of 08/17/16 (from the past 48 hour(s))  Glucose, capillary     Status: Abnormal   Collection Time: 08/20/16  5:41 AM  Result Value Ref Range   Glucose-Capillary 230 (H) 65 - 99 mg/dL   Comment 1 Notify RN   Prolactin     Status: None   Collection Time: 08/20/16  6:25 AM  Result Value Ref Range   Prolactin 10.7 4.0 - 15.2 ng/mL    Comment: (NOTE) Performed At: [facility] [place], [place] [id number] Lindon Romp MD [id number] Performed at [facility]   TSH     Status: None   Collection Time: 08/20/16  6:25 AM  Result Value Ref Range   TSH 1.416 0.350 - 4.500 uIU/mL    Comment: Performed at [facility]  Glucose, capillary     Status: Abnormal   Collection Time: 08/21/16  6:05 AM  Result Value Ref Range   Glucose-Capillary 207 (H) 65 - 99 mg/dL    Blood Alcohol level:  Lab Results  Component Value Date   ETH <5 19/50/9326   Metabolic Disorder Labs: Lab Results  Component Value Date   HGBA1C 11.2 (H) 08/19/2016   MPG 275 08/19/2016    Lab Results  Component Value Date   PROLACTIN 10.7 08/20/2016   Lab Results  Component Value Date   CHOL 218 (H) 08/19/2016   TRIG 192 (H) 08/19/2016   HDL 37 (L) 08/19/2016  CHOLHDL 5.9 08/19/2016   VLDL 38 08/19/2016   LDLCALC 143 (H) 08/19/2016   Physical Findings: AIMS: Facial and Oral Movements Muscles of Facial Expression: None, normal Lips and Perioral Area: None, normal Jaw: None, normal Tongue: None, normal,Extremity Movements Upper (arms, wrists, hands, fingers): None, normal Lower (legs, knees, ankles, toes): None, normal, Trunk Movements Neck, shoulders, hips: None, normal, Overall Severity Severity of abnormal movements (highest score from questions above): None, normal Incapacitation due to abnormal movements: None, normal Patient's awareness of abnormal movements (rate only patient's report): No Awareness, Dental Status Current problems with teeth and/or dentures?: No Does patient usually wear dentures?: No  CIWA:    COWS:     Musculoskeletal: Strength & Muscle Tone: within normal limits Gait & Station: normal Patient leans: N/A  Psychiatric Specialty Exam: Physical Exam: See H&P  Review of Systems  Psychiatric/Behavioral: Positive for depression (increased tearfulness) and substance abuse (Hx, Cannabis use disorder). Negative for hallucinations, memory loss and suicidal ideas. The patient is nervous/anxious and has insomnia.     Blood pressure 135/74, pulse 76, temperature 97.8 F (36.6 C), temperature source Oral, resp. rate 16, height 6' 1"  (1.854 m), weight 230 lb (104.3 kg), SpO2 98 %.Body mass index is 30.34 kg/m.  General Appearance:  fairly groomed   Eye Contact:  Good  Speech:  Clear and Coherent  Volume:  Normal  Mood:  presents with ongoing depression  Affect:  labile, intermittently tearful   Thought Process:  Linear   Orientation:  Full (Time, Place, and Person)  Thought Content:  linear   Suicidal Thoughts:  No- denies current  suicidal ideations, denies any self injurious ideations,  contracts for safety on unit   Homicidal Thoughts: denies plan or intention of HI or of hurting his ex wife. States he has no intention of seeking her after discharge and plans to avoid her .  Memory:  recent and remote grossly intact   Judgement:  Fair  Insight:  Fair  Psychomotor Activity:  remains vaguely restless , but was able to sit through session without significant agitation or restlessness   Concentration:  Concentration: good  and Attention Span: good   Recall: good   Fund of Knowledge:  good  Language:  good  Akathisia:  No  Handed:  Right  AIMS (if indicated):     Assets:  Communication Skills Desire for Improvement  ADL's:  Intact  Cognition:  WNL  Sleep:  Number of Hours: 5.5      Assessment - patient remains labile, vaguely irritable, but also depressed, sad, tearful at times. Presentation consistent with mixed presentation . At this time denies any suicidal ideations . He is tolerating medications well, and has tolerated Seroquel trial well thus far. Sleep has been poor .   Treatment Plan Summary: Daily contact with patient to assess and evaluate symptoms and progress in treatment and Medication management: Bipolar disorder: Increase Seroquel to 25 mgrs BID and 150 mgrs QHS  Mood instability: Will continue  Tegretol 200 mg BID Agitation/pain: Will continue   Gabapentin to 600 mg QID. Will also start Lidoderm patch to help address chronic back pain High anxiety levels:Start Ativan 0.5 mgrs Q 6 hours PRN for anxiety as needed   High anxiety: Continue  Propranolol 10 mg TID  Insomnia: Decrease  Trazodone to 50  mgrs QHS PRN for insomnia - to minimize excessive sedation, as Seroquel QHS dosing is increased . Treatment team working on disposition planning options  Will check Carbamazepine serum level  In AM, and make dose adjustments depending on result /clinical progress   Neita Garnet, MD 08/21/2016, 12:28  PMPatient ID: Mitchell George, male   DOB: 1976-10-19, 40 y.o.   MRN: [id number]

## 2016-08-21 NOTE — Progress Notes (Signed)
D: Pt passive SI-contracts, pt stated he was having AH seeing black orbs around people. Pt is pleasant and cooperative. Pt appears very needy on the unit this evening requesting various things from staff. Pt focused on his meds and appears to just want to take whatever he has.   A: Pt was offered support and encouragement. Pt was given scheduled medications. Pt was encourage to attend groups. Q 15 minute checks were done for safety.   R:Pt attends groups and interacts well with peers and staff. Pt is taking medication.Pt receptive to treatment and safety maintained on unit.

## 2016-08-21 NOTE — Progress Notes (Signed)
Patient came to nurse stating, "the doctor said he was going to change my orders.  He said he was going to give me some seroquel after lunch.  Informed patient that his seroquel had been increase, however, it was for nighttime.  Patient was irritable stating he is constantly in pain.  He states, "I don't know when all my medications are due.  I need a print out."  I informed patient that his next ibuprofen is due at 1400.  He states, "my ibuprofen and robaxin was supposed to be changed."  Patient states, "what do I have to do, show my ass?  I bet if I show my ass, ya'll will give me an injection."  Informed patient that it would not help him to do so.

## 2016-08-21 NOTE — Progress Notes (Signed)
Recreation Therapy Notes  Animal-Assisted Activity (AAA) Program Checklist/Progress Notes Patient Eligibility Criteria Checklist & Daily Group note for Rec TxIntervention  Date: 10.03.2017 Time: 2:45pm [address]    AAA/T Program Assumption of Risk Form signed by Patient/ or Parent Legal Guardian Yes  Patient is free of allergies or sever asthma Yes  Patient reports no fear of animals Yes  Patient reports no history of cruelty to animals Yes  Patient understands his/her participation is voluntary Yes  Behavioral Response: Did not attend.   Marykay Lexenise L Sai Zinn, LRT/CTRS        Feleica Fulmore L 08/21/2016 3:05 PM

## 2016-08-21 NOTE — Plan of Care (Signed)
Problem: Education: Goal: Emotional status will improve Outcome: Not Progressing Patient has been irritable and threatening to act out to receive medication.

## 2016-08-21 NOTE — B[facility] Group Notes (Signed)
[place] LCSW Group Therapy 08/21/2016 1:15 PM  Type of Therapy: Group Therapy- Feelings about Diagnosis  Participation Level: Intrusive, Monopolizng  Participation Quality:  Appropriate, but attention-seeking at times  Affect:  Appropriate  Cognitive: Alert and Oriented   Insight:  Limited  Engagement in Therapy: Developing/Improving  Modes of Intervention: Clarification, Confrontation, Discussion, Education, Exploration, Limit-setting, Orientation, Problem-solving, Rapport Building, Art therapist, Socialization and Support  Description of Group:   This group will allow patients to explore their thoughts and feelings about diagnoses they have received. Patients will be guided to explore their level of understanding and acceptance of these diagnoses. Facilitator will encourage patients to process their thoughts and feelings about the reactions of others to their diagnosis, and will guide patients in identifying ways to discuss their diagnosis with significant others in their lives. This group will be process-oriented, with patients participating in exploration of their own experiences as well as giving and receiving support and challenge from other group members.  Summary of Progress/Problems:  Pt participation was intrusive, as he was observed to cut people off during their sentences and turn the conversation back to himself. Pt required some redirection when he began to talk about violence as a coping mechanism or to get his needs met.   Therapeutic Modalities:   Cognitive Behavioral Therapy Solution Focused Therapy Motivational Interviewing Relapse Prevention Therapy  Peri Maris, LCSWA 08/21/2016 4:05 PM

## 2016-08-21 NOTE — Plan of Care (Signed)
Problem: Coping: Goal: Ability to verbalize frustrations and anger appropriately will improve Outcome: Not Progressing Pt continues to get mad on the unit and express his anger in various outburst such as throwing things and cursing.

## 2016-08-21 NOTE — Progress Notes (Signed)
Inpatient Diabetes Program Recommendations  AACE/ADA: New Consensus Statement on Inpatient Glycemic Control (2015)  Target Ranges:  Prepandial:   less than 140 mg/dL      Peak postprandial:   less than 180 mg/dL (1-2 hours)      Critically ill patients:  140 - 180 mg/dL   Lab Results  Component Value Date   GLUCAP 207 (H) 08/21/2016   HGBA1C 11.2 (H) 08/19/2016    Review of Glycemic Control  Diabetes history: DM2 Outpatient Diabetes medications: None, previously on metformin and glipizide 1-2 years ago. Current orders for Inpatient glycemic control: metformin 500 mg bid  Recent HgbA1C of 11.2% indicates poor glycemic control at home.  Inpatient Diabetes Program Recommendations:    Novolog moderate tidwc and hs. If FBS or am CBGs > 180 mg/dL, add Lantus 15 units [id number]. Needs f/u with PCP for management of his diabetes.  Will follow. Thank you. Ailene Ardshonda Angello Chien, RD, LDN, CDE Inpatient Diabetes Coordinator [phone]

## 2016-08-22 LAB — GLUCOSE, CAPILLARY: GLUCOSE-CAPILLARY: 228 mg/dL — AB (ref 65–99)

## 2016-08-22 LAB — CARBAMAZEPINE LEVEL, TOTAL: Carbamazepine Lvl: 5.1 ug/mL (ref 4.0–12.0)

## 2016-08-22 MED ORDER — QUETIAPINE FUMARATE 50 MG PO TABS
50.0000 mg | ORAL_TABLET | Freq: Two times a day (BID) | ORAL | Status: DC
  Administered 2016-08-22 – 2016-08-27 (×10): 50 mg via ORAL
  Filled 2016-08-22 (×7): qty 1
  Filled 2016-08-22: qty 14
  Filled 2016-08-22 (×2): qty 1
  Filled 2016-08-22: qty 14
  Filled 2016-08-22 (×3): qty 1

## 2016-08-22 MED ORDER — LORAZEPAM 2 MG/ML IJ SOLN
INTRAMUSCULAR | Status: AC
  Administered 2016-08-22: 2 mg via INTRAMUSCULAR
  Filled 2016-08-22: qty 1

## 2016-08-22 MED ORDER — LORAZEPAM 1 MG PO TABS: 1.0000 mg | ORAL_TABLET | ORAL | Status: DC

## 2016-08-22 MED ORDER — LORAZEPAM 0.5 MG PO TABS
0.5000 mg | ORAL_TABLET | Freq: Three times a day (TID) | ORAL | Status: DC | PRN
  Administered 2016-08-22 – 2016-08-23 (×2): 0.5 mg via ORAL
  Filled 2016-08-22 (×3): qty 1

## 2016-08-22 MED ORDER — LORAZEPAM 1 MG PO TABS
2.0000 mg | ORAL_TABLET | ORAL | Status: DC | PRN
Start: 2016-08-22 — End: 2016-08-22

## 2016-08-22 MED ORDER — LORAZEPAM 1 MG PO TABS: 1.0000 mg | ORAL_TABLET | Freq: Three times a day (TID) | ORAL | Status: DC | PRN

## 2016-08-22 MED ORDER — CARBAMAZEPINE 200 MG PO TABS
400.0000 mg | ORAL_TABLET | Freq: Every day | ORAL | Status: DC
  Administered 2016-08-22: 400 mg via ORAL
  Filled 2016-08-22 (×3): qty 2

## 2016-08-22 MED ORDER — DIPHENHYDRAMINE HCL 50 MG/ML IJ SOLN
INTRAMUSCULAR | Status: AC
  Administered 2016-08-22: 50 mg via INTRAMUSCULAR
  Filled 2016-08-22: qty 1

## 2016-08-22 MED ORDER — LORAZEPAM 2 MG/ML IJ SOLN
2.0000 mg | Freq: Once | INTRAMUSCULAR | Status: AC
Start: 2016-08-22 — End: 2016-08-22
  Administered 2016-08-22: 2 mg via INTRAMUSCULAR

## 2016-08-22 MED ORDER — DIPHENHYDRAMINE HCL 50 MG/ML IJ SOLN
50.0000 mg | Freq: Once | INTRAMUSCULAR | Status: AC
  Administered 2016-08-22: 50 mg via INTRAMUSCULAR
  Filled 2016-08-22: qty 1

## 2016-08-22 MED ORDER — PANTOPRAZOLE SODIUM 20 MG PO TBEC
20.0000 mg | DELAYED_RELEASE_TABLET | Freq: Two times a day (BID) | ORAL | Status: DC
  Administered 2016-08-22 – 2016-08-27 (×10): 20 mg via ORAL
  Filled 2016-08-22 (×2): qty 1
  Filled 2016-08-22: qty 14
  Filled 2016-08-22 (×6): qty 1
  Filled 2016-08-22: qty 14
  Filled 2016-08-22 (×4): qty 1

## 2016-08-22 MED ORDER — ZIPRASIDONE MESYLATE 20 MG IM SOLR
INTRAMUSCULAR | Status: AC
Start: 2016-08-22 — End: 2016-08-22
  Administered 2016-08-22: 20 mg
  Filled 2016-08-22: qty 20

## 2016-08-22 MED ORDER — OLANZAPINE 10 MG PO TBDP: 10.0000 mg | ORAL_TABLET | Freq: Three times a day (TID) | ORAL | Status: DC | PRN

## 2016-08-22 MED ORDER — ZIPRASIDONE MESYLATE 20 MG IM SOLR
20.0000 mg | INTRAMUSCULAR | Status: DC | PRN
  Filled 2016-08-22: qty 20

## 2016-08-22 MED ORDER — CARBAMAZEPINE 200 MG PO TABS
200.0000 mg | ORAL_TABLET | Freq: Every day | ORAL | Status: DC
  Administered 2016-08-23: 200 mg via ORAL
  Filled 2016-08-22 (×3): qty 1

## 2016-08-22 MED ORDER — QUETIAPINE FUMARATE 200 MG PO TABS
200.0000 mg | ORAL_TABLET | Freq: Every day | ORAL | Status: DC
  Administered 2016-08-22: 200 mg via ORAL
  Filled 2016-08-22 (×4): qty 1

## 2016-08-22 NOTE — Progress Notes (Signed)
[facility] MD Progress Note  08/22/2016 4:19 PM ESTEFAN PATTISON  MRN:  [id number]  Subjective: Patient  reports ongoing symptoms- he reports his mood remains labile , and reports both feelings of depression ( such as sadness, low self esteem, feeling he wants to cry) and irritability ( feeling easily irritated, angered , wanting to " fight the police if they get called on me ". )  He denies medication side effects. Objective: I have discussed case with treatment team and have met with patient .  As reviewed with staff, patient exhibiting some needy behaviors, somatic, medication focused, focused on chronic pain . He is redirectable, and has not exhibited any explosive or overtly disruptive behavior  Patient states he has been trying to find ways to help feel calmer and he has been journaling,and has also been visible in day room, interacting appropriately with selected peers . He reports occasional passive thoughts of death, dying, but denies plan or intention of hurting self or of suicide , and contracts for safety on unit. He is future oriented , and spoke about fixing up a motorcycle he has after discharge. No medication side effects. Reports ongoing insomnia, subjective sense of racing thoughts  Labs- Carbamazepine serum level 5.1  Principal Problem: Bipolar 1 disorder, depressed, severe Diagnosis:   Patient Active Problem List   Diagnosis Date Noted  . Bipolar disorder, current episode mixed, moderate ([place]) [F31.62]   . Bipolar 1 disorder, depressed, severe ([place]) [F31.4] 08/17/2016  . PTSD (post-traumatic stress disorder) [F43.10] 08/17/2016  . Affective psychosis, bipolar ([place]) [F31.9] 08/17/2016   Total Time spent with patient: 20 minutes   Past Psychiatric History: PTSD, Bipolar 1 disorder.  Past Medical History:  Past Medical History:  Diagnosis Date  . Bipolar 1 disorder ([place])   . Diabetes mellitus   . PTSD (post-traumatic stress disorder)   . Schizophrenia [facility])     Past  Surgical History:  Procedure Laterality Date  . HAND SURGERY    . KNEE SURGERY     Family History: History reviewed. No pertinent family history. Family Psychiatric  History: See H&P  Social History:  History  Alcohol Use  . Yes     History  Drug Use  . Types: Marijuana    Social History   Social History  . Marital status: Single    Spouse name: N/A  . Number of children: N/A  . Years of education: N/A   Social History Main Topics  . Smoking status: Current Every Day Smoker    Packs/day: 1.00  . Smokeless tobacco: Never Used  . Alcohol use Yes  . Drug use:     Types: Marijuana  . Sexual activity: Not Asked   Other Topics Concern  . None   Social History Narrative  . None   Additional Social History:    Sleep:  Fair, but has improved partially    Appetite:  Fair  Current Medications: Current Facility-Administered Medications  Medication Dose Route Frequency Provider Last Rate Last Dose  . alum & mag hydroxide-simeth (MAALOX/MYLANTA) 200-200-20 MG/5ML suspension 30 mL  30 mL Oral PRN Patrecia Pour, NP   30 mL at 08/22/16 1118  . atorvastatin (LIPITOR) tablet 40 mg  40 mg Oral q1800 Encarnacion Slates, NP   40 mg at 08/21/16 1811  . [START ON 08/23/2016] carbamazepine (TEGRETOL) tablet 200 mg  200 mg Oral Daily Myer Peer Shellie Goettl, MD      . carbamazepine (TEGRETOL) tablet 400 mg  400 mg Oral  QHS Myer Peer Massa Pe, MD      . gabapentin (NEURONTIN) capsule 600 mg  600 mg Oral QID Encarnacion Slates, NP   600 mg at 08/22/16 1200  . hydrOXYzine (ATARAX/VISTARIL) tablet 50 mg  50 mg Oral Q6H PRN Encarnacion Slates, NP   50 mg at 08/22/16 1044  . ibuprofen (ADVIL,MOTRIN) tablet 800 mg  800 mg Oral Q6H PRN Encarnacion Slates, NP   800 mg at 08/22/16 1445  . Influenza vac split quadrivalent PF (FLUARIX) injection 0.5 mL  0.5 mL Intramuscular Tomorrow-1000 Ahnaf Caponi A Neima Lacross, MD      . lidocaine (LIDODERM) 5 % 1 patch  1 patch Transdermal Q24H Jenne Campus, MD   1 patch at 08/22/16 0800  .  LORazepam (ATIVAN) tablet 0.5 mg  0.5 mg Oral Q8H PRN Myer Peer Lana Flaim, MD      . magnesium hydroxide (MILK OF MAGNESIA) suspension 30 mL  30 mL Oral Daily PRN Patrecia Pour, NP      . metFORMIN (GLUCOPHAGE) tablet 500 mg  500 mg Oral BID WC Patrecia Pour, NP   500 mg at 08/22/16 0800  . methocarbamol (ROBAXIN) tablet 500 mg  500 mg Oral Q6H PRN Jenne Campus, MD   500 mg at 08/22/16 1445  . nicotine polacrilex (NICORETTE) gum 2 mg  2 mg Oral PRN Jenne Campus, MD   2 mg at 08/22/16 1318  . ondansetron (ZOFRAN) tablet 4 mg  4 mg Oral Q8H PRN Patrecia Pour, NP      . pantoprazole (PROTONIX) EC tablet 20 mg  20 mg Oral Daily Jenne Campus, MD   20 mg at 08/22/16 0800  . propranolol (INDERAL) tablet 10 mg  10 mg Oral TID Encarnacion Slates, NP   10 mg at 08/22/16 1200  . QUEtiapine (SEROQUEL) tablet 200 mg  200 mg Oral QHS Myer Peer Kimie Pidcock, MD      . QUEtiapine (SEROQUEL) tablet 50 mg  50 mg Oral BID Jenne Campus, MD      . traZODone (DESYREL) tablet 50 mg  50 mg Oral QHS PRN Jenne Campus, MD   50 mg at 08/21/16 2220   Lab Results:  Results for orders placed or performed during the hospital encounter of 08/17/16 (from the past 48 hour(s))  Glucose, capillary     Status: Abnormal   Collection Time: 08/21/16  6:05 AM  Result Value Ref Range   Glucose-Capillary 207 (H) 65 - 99 mg/dL  Glucose, capillary     Status: Abnormal   Collection Time: 08/22/16  6:24 AM  Result Value Ref Range   Glucose-Capillary 228 (H) 65 - 99 mg/dL  Carbamazepine level, total     Status: None   Collection Time: 08/22/16  6:46 AM  Result Value Ref Range   Carbamazepine Lvl 5.1 4.0 - 12.0 ug/mL    Comment: Performed at [facility]    Blood Alcohol level:  Lab Results  Component Value Date   [facility] <5 98/26/4158   Metabolic Disorder Labs: Lab Results  Component Value Date   HGBA1C 10.7 (H) 08/20/2016   MPG 260 08/20/2016   MPG 275 08/19/2016   Lab Results  Component Value Date    PROLACTIN 10.7 08/20/2016   Lab Results  Component Value Date   CHOL 218 (H) 08/19/2016   TRIG 192 (H) 08/19/2016   HDL 37 (L) 08/19/2016   CHOLHDL 5.9 08/19/2016   VLDL 38 08/19/2016  LDLCALC 143 (H) 08/19/2016   Physical Findings: AIMS: Facial and Oral Movements Muscles of Facial Expression: None, normal Lips and Perioral Area: None, normal Jaw: None, normal Tongue: None, normal,Extremity Movements Upper (arms, wrists, hands, fingers): None, normal Lower (legs, knees, ankles, toes): None, normal, Trunk Movements Neck, shoulders, hips: None, normal, Overall Severity Severity of abnormal movements (highest score from questions above): None, normal Incapacitation due to abnormal movements: None, normal Patient's awareness of abnormal movements (rate only patient's report): No Awareness, Dental Status Current problems with teeth and/or dentures?: No Does patient usually wear dentures?: No  CIWA:    COWS:     Musculoskeletal: Strength & Muscle Tone: within normal limits Gait & Station: normal Patient leans: N/A  Psychiatric Specialty Exam: Physical Exam: See H&P  Review of Systems  Psychiatric/Behavioral: Positive for depression (increased tearfulness) and substance abuse (Hx, Cannabis use disorder). Negative for hallucinations, memory loss and suicidal ideas. The patient is nervous/anxious and has insomnia.   reports chronic pain, affecting legs and back  Blood pressure 130/83, pulse 66, temperature 97.8 F (36.6 C), temperature source Oral, resp. rate 16, height 6' 1"  (1.854 m), weight 230 lb (104.3 kg), SpO2 98 %.Body mass index is 30.34 kg/m.  General Appearance:  fairly groomed   Eye Contact:  Good  Speech:  Clear and Coherent- not pressured  Volume:  Normal  Mood:  remains vaguely depressed, dysphoric   Affect:  some improvement, smiles at times appropriately, but still briefly tearful at times   Thought Process:  Linear   Orientation:  Full (Time, Place, and  Person)  Thought Content:  linear , no flight of ideations noted   Suicidal Thoughts:  reports occasional passive thoughts of death, but at this time suicidal ideations, denies any self injurious ideations,  contracts for safety on unit   Homicidal Thoughts: denies plan or intention of HI or of hurting his ex wife. States he has no intention of seeking her after discharge and plans to avoid her . Does make statement that if cops were to try to subdue him physically he would " hurt them", but denies any actual plan or intention of hurting anyone .   Memory:  recent and remote grossly intact   Judgement:  Fair  Insight:  Fair  Psychomotor Activity: less restless today, presents calm  Concentration:  Concentration: good  and Attention Span: good   Recall: good   Fund of Knowledge:  good  Language:  good  Akathisia:  No  Handed:  Right  AIMS (if indicated):     Assets:  Communication Skills Desire for Improvement  ADL's:  Intact  Cognition:  WNL  Sleep:  Number of Hours: 5.5      Assessment - patient continues to present with some depression and affective lability, although has improved partially since admission .  He is reporting depression, irritability, and presents dysphoric. Of note, he reports some of these symptoms are chronic, and states he has had " temper problems", irritability, explosiveness for most of his life . At this time denies suicidal ideations. He does feel medications are helping partially, and denies side effects. Carbamazepine trial well tolerated thus far. Serum level low therapeutic.  Treatment Plan Summary: Daily contact with patient to assess and evaluate symptoms and progress in treatment and Medication management: Bipolar disorder: Will  Seroquel to 50  mgrs BID and 200 mgrs QHS  Mood instability: Will increase Tegretol to 200 mgrs QAM and 400 mgrs QHS . Agitation/pain: Will continue Gabapentin  to 600 mg QID. Will continue Lidoderm patch to help address chronic  back pain High anxiety levels:  Continue Ativan 0.5 mgrs Q 6 hours PRN for anxiety as needed   High anxiety: Continue  Propranolol 10 mg TID  Insomnia: Continue Trazodone 50  mgrs QHS PRN for insomnia . Treatment team working on disposition planning options  Will check EKG to monitor QTc     Neita Garnet, MD 08/22/2016, 4:19 PMPatient ID: Charyl Bigger, male   DOB: 1976-02-08, 40 y.o.   MRN: [id number]

## 2016-08-22 NOTE — Progress Notes (Signed)
Recreation Therapy Notes  Date: 08/22/16 Time: 0930 Location: 300 Hall Group Room  Group Topic: Stress Management  Goal Area(s) Addresses:  Patient will verbalize importance of using healthy stress management.  Patient will identify positive emotions associated with healthy stress management.   Behavioral Response: Engaged  Intervention: Stress Management  Activity :  Progressive Muscle Relaxation.  LRT introduced the technique of progressive muscle relaxation to the group.  LRT read script to engage patients in the technique.  Patients were to follow along as LRT read a script to participate in activity.  Education:  Stress Management, Discharge Planning.   Education Outcome: Acknowledges edcuation/In group clarification offered/Needs additional education  Clinical Observations/Feedback: Pt attended group.    Caroll RancherMarjette Rasmus Preusser, LRT/CTRS         Caroll RancherLindsay, Amyla Heffner A 08/22/2016 11:41 AM

## 2016-08-22 NOTE — Progress Notes (Signed)
Patient ID: Mitchell George, male   DOB: 11-20-1975, 40 y.o.   MRN: [id number]   Pt currently presents with a flat affect and hostile, labile behavior behavior. Pt makes verbal threats towards objects and staff on the unit. Pt reports to Clinical research associatewriter that their goal is to "figure out whether or not she is messing with my equipment." Pt states "and if I found out she has I am getting out of here, I'm voluntary, I should be able to sign myself out like I signed myself in" Pt reports poor sleep with current medication regimen.   Pt provided with scheduled and as needed medications per providers orders. Pt's labs and vitals were monitored throughout the night. Pt supported emotionally and encouraged to express concerns and questions. Pt educated on medications and alternative relaxation techniques.  Pt's safety ensured with 15 minute and environmental checks. Pt endorses vague HI towards staff and his girlfriend. Pt currently denies SI and A/V hallucinations. Pt verbally agrees to seek staff if SI/HI or A/VH occurs and to consult with staff before acting on any harmful thoughts. Will continue POC.

## 2016-08-22 NOTE — [facility] Group Notes (Signed)
[facility] LCSW Group Therapy 08/22/2016 1:15 PM  Type of Therapy: Group Therapy- Emotion Regulation  Participation Level: Monopolizing  Participation Quality:  Off-topic, negative  Affect: Appropriate  Cognitive: Alert and Oriented   Insight:  Limited  Engagement in Therapy: Developing/Improving and Engaged   Modes of Intervention: Clarification, Confrontation, Discussion, Education, Exploration, Limit-setting, Orientation, Problem-solving, Rapport Building, Dance movement psychotherapisteality Testing, Socialization and Support  Summary of Progress/Problems: The topic for group today was emotional regulation. This group focused on both positive and negative emotion identification and allowed group members to process ways to identify feelings, regulate negative emotions, and find healthy ways to manage internal/external emotions. Group members were asked to reflect on a time when their reaction to an emotion led to a negative outcome and explored how alternative responses using emotion regulation would have benefited them. Group members were also asked to discuss a time when emotion regulation was utilized when a negative emotion was experienced. Pt expressed that his anger is difficult to control. Pt is resistant at times when being challenged to identify changes he can make. Pt speaks with extreme language and expresses that his emotions are almost uncontrollable.    Chad CordialLauren Carter, LCSWA 08/22/2016 3:21 PM
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
## 2016-08-22 NOTE — Plan of Care (Signed)
Problem: Safety: Goal: Periods of time without injury will increase Outcome: Progressing Patient has not engaged in self harm.  Problem: Activity: Goal: Interest or engagement in leisure activities will improve Outcome: Progressing Patient is quite social with peers in dayroom. Plays cards and interacts appropriately.

## 2016-08-22 NOTE — Progress Notes (Addendum)
Patient ID: Mitchell George, male   DOB: 1976/02/27, 40 y.o.   MRN: [id number]   Pt continues to threaten to break the window in his room or fight with a staff member on the unit. Pt states "If we fight, he better put me down because otherwise I'm going to kill him." Pt given as needed and scheduled medications. Pt reports that he wants to talk to his friend, states "She will help me calm down." Provider notified. Pt emotionally supported and encouraged to come to staff before doing anything to harm himself or others.

## 2016-08-22 NOTE — Progress Notes (Addendum)
D: Patient up and visible in the milieu. Somatic and has near hourly requests for medications. Spoke with patient 1:1. Rates sleep poor, appetite fair, energy low and concentration poor. Patient's affect animated, mood anxious. Rating his depression at a 4/10, hopelessness at a 5/10 and anxiety at a 9/10. States goal for today is to "try to get out of here, whatever it takes." Continues with chronic back and bilat foot pain of a 10/10 and 7/10, respectively. He continues to state "I smoke weed because I can't get pain relief. And I take my father's pain pills. Dilaudid gives me relief for about 6 hours. That's really the only thing that helps."  Complaining of anxiety and agitation.   A: Medicated per orders, advil, robaxin and ativan prn given. Emotional support offered and self inventory reviewed. Cautioned by this Clinical research associatewriter and pharmacist against opioid use as well as taking other people's medications.   R: On reassess, patient's pain is 8/10, 5/10. He verbalizes understanding regarding information given however states he will continue using opiates. Patient denies SI/HI and remains safe on level III obs.

## 2016-08-22 NOTE — Progress Notes (Signed)
Patient did not attend wrap up group. 

## 2016-08-23 LAB — GLUCOSE, CAPILLARY: Glucose-Capillary: 184 mg/dL — ABNORMAL HIGH (ref 65–99)

## 2016-08-23 MED ORDER — QUETIAPINE FUMARATE 300 MG PO TABS
300.0000 mg | ORAL_TABLET | Freq: Every day | ORAL | Status: DC
  Administered 2016-08-23 – 2016-08-26 (×4): 300 mg via ORAL
  Filled 2016-08-23 (×5): qty 1
  Filled 2016-08-23: qty 7
  Filled 2016-08-23: qty 1

## 2016-08-23 MED ORDER — DIVALPROEX SODIUM ER 500 MG PO TB24
500.0000 mg | ORAL_TABLET | Freq: Every day | ORAL | Status: DC
  Administered 2016-08-23 – 2016-08-24 (×2): 500 mg via ORAL
  Filled 2016-08-23 (×5): qty 1
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
## 2016-08-23 NOTE — [facility] Group Notes (Signed)
[facility] [employer] Group Therapy 08/23/2016 1:15pm  Type of Therapy: Mental Health Association Presentation  Participation Level: Active  Participation Quality: Attentive  Affect: Appropriate  Cognitive: Oriented  Insight: Developing/Improving  Engagement in Therapy: Engaged  Modes of Intervention: Discussion, Education and Socialization  Summary of Progress/Problems: Mental Health Association (MHA) Speaker came to talk about his personal journey with substance abuse and addiction. The pt processed ways by which to relate to the speaker. MHA speaker provided handouts and educational information pertaining to groups and services offered by the MHA. Pt was engaged in speaker's presentation and was receptive to resources provided.    Simcha Farrington Carter, LCSWA 08/23/2016 1:35 PM  

## 2016-08-23 NOTE — Progress Notes (Signed)
Patient ID: Mitchell George, male   DOB: 09/15/1976, 40 y.o.   MRN: [id number]  Pt currently lying in bed with eyes closed, breathing regular and unlabored. Will continue to monitor. Maintain safety with continuous 15 minute checks.

## 2016-08-23 NOTE — [facility] Suicide Risk Assessment (Signed)
[facility] INPATIENT:  Family/Significant Other Suicide Prevention Education  Suicide Prevention Education:  Education Completed; Cindy Case, Pt's friend [phone], has been identified by the patient as the family member/significant other with whom the patient will be residing, and identified as the person(s) who will aid the patient in the event of a mental health crisis (suicidal ideations/suicide attempt).  With written consent from the patient, the family member/significant other has been provided the following suicide prevention education, prior to the and/or following the discharge of the patient.  The suicide prevention education provided includes the following:  Suicide risk factors  Suicide prevention and interventions  National Suicide Hotline telephone number  [facility] assessment telephone number  [facility] Assistance 911  [facility] and/or Residential Mobile Crisis Unit telephone number  Request made of family/significant other to:  Remove weapons (e.g., guns, rifles, knives), all items previously/currently identified as safety concern.    Remove drugs/medications (over-the-counter, prescriptions, illicit drugs), all items previously/currently identified as a safety concern.  The family member/significant other verbalizes understanding of the suicide prevention education information provided.  The family member/significant other agrees to remove the items of safety concern listed above.  Elaina HoopsLauren M Carter 08/23/2016, 4:07 PM

## 2016-08-23 NOTE — Progress Notes (Signed)
Expand All Collapse All    Inpatient Diabetes Program Recommendations  AACE/ADA: New Consensus Statement on Inpatient Glycemic Control (2015)  Target Ranges:  Prepandial:              less than 140 mg/dL                            Peak postprandial:    less than 180 mg/dL (1-2 hours)                            Critically ill patients:  140 - 180 mg/dL   RecentLabs       Lab Results  Component Value Date   GLUCAP 207 (H) 08/21/2016   HGBA1C 11.2 (H) 08/19/2016      Review of Glycemic Control  Diabetes history: DM2 Outpatient Diabetes medications: None, previously on metformin and glipizide 1-2 years ago. Current orders for Inpatient glycemic control: metformin 500 mg bid  Recent HgbA1C of 11.2% indicates poor glycemic control at home.  Inpatient Diabetes Program Recommendations:    Novolog moderate tidwc and hs. If FBS or am CBGs > 180 mg/dL, add Lantus 15 units [id number]. Needs f/u with PCP for management of his diabetes.  Will follow. Thank you. Mitchell George, RD, LDN, CDE Inpatient Diabetes Coordinator [phone]

## 2016-08-23 NOTE — Progress Notes (Signed)
[facility] MD Progress Note  08/23/2016 3:37 PM Mitchell George  MRN:  [id number]  Subjective: Patient states he is feeling better at this time, but continues to report feeling depressed, labile , irritable .  Objective: I have discussed case with treatment team and have met with patient . Events of last evening noted- as discussed with staff, patient had episode of acute agitation, anger , during which he  threatened elopement, and to throw chair through window.  [employer] was called, patient eventually calmed down and was given PRN medications, after which he slept. Patient states his agitation was related to an argument with his  GF , Mitchell George, whom he states threatened to destroy his property during the argument.  I contacted Rochelle at patient's request and in his presence- she stated she remained committed to their relationship and denied any intention to destroy any of his belongings or property. Patient reported feeling calmer, partially relieved by this . Although today calmer, cooperative, patient does continue to present with labile mood, vague irritability, dysphoria, and some intrusiveness, for example often knocking on MD's office door even when other patients being seen . Of note, IVC was generated yesterday during above episode- at this time patient reports he is agreeing to remain in hospital because he realizes he needs help and further stabilization prior to discharge .  Patient's glucose serum levels have tended to range from high 100's to low 200's - patient states " that is about what they have been before I came to the hospital". Diabetic Consult has recommended adding insulin- patient declines to start insulin at this time, states he will continue to take PO hypoglycemic medication.     Principal Problem: Bipolar 1 disorder, depressed, severe Diagnosis:   Patient Active Problem List   Diagnosis Date Noted  . Bipolar disorder, current episode mixed, moderate ([place]) [F31.62]   . Bipolar 1  disorder, depressed, severe ([place]) [F31.4] 08/17/2016  . PTSD (post-traumatic stress disorder) [F43.10] 08/17/2016  . Affective psychosis, bipolar ([place]) [F31.9] 08/17/2016   Total Time spent with patient: 25 minutes   Past Psychiatric History: PTSD, Bipolar 1 disorder.  Past Medical History:  Past Medical History:  Diagnosis Date  . Bipolar 1 disorder ([place])   . Diabetes mellitus   . PTSD (post-traumatic stress disorder)   . Schizophrenia [facility])     Past Surgical History:  Procedure Laterality Date  . HAND SURGERY    . KNEE SURGERY     Family History: History reviewed. No pertinent family history. Family Psychiatric  History: See H&P  Social History:  History  Alcohol Use  . Yes     History  Drug Use  . Types: Marijuana    Social History   Social History  . Marital status: Single    Spouse name: N/A  . Number of children: N/A  . Years of education: N/A   Social History Main Topics  . Smoking status: Current Every Day Smoker    Packs/day: 1.00  . Smokeless tobacco: Never Used  . Alcohol use Yes  . Drug use:     Types: Marijuana  . Sexual activity: Not Asked   Other Topics Concern  . None   Social History Narrative  . None   Additional Social History:    Sleep:  Improved, after PRN medication for agitation yesterday evening   Appetite:  Fair  Current Medications: Current Facility-Administered Medications  Medication Dose Route Frequency Provider Last Rate Last Dose  . alum & mag hydroxide-simeth (MAALOX/MYLANTA)  200-200-20 MG/5ML suspension 30 mL  30 mL Oral PRN Patrecia Pour, NP   30 mL at 08/22/16 1118  . atorvastatin (LIPITOR) tablet 40 mg  40 mg Oral q1800 Encarnacion Slates, NP   40 mg at 08/22/16 1705  . carbamazepine (TEGRETOL) tablet 200 mg  200 mg Oral Daily Jenne Campus, MD   200 mg at 08/23/16 0820  . carbamazepine (TEGRETOL) tablet 400 mg  400 mg Oral QHS Jenne Campus, MD   400 mg at 08/22/16 2026  . gabapentin (NEURONTIN) capsule 600 mg   600 mg Oral QID Encarnacion Slates, NP   600 mg at 08/23/16 1130  . hydrOXYzine (ATARAX/VISTARIL) tablet 50 mg  50 mg Oral Q6H PRN Encarnacion Slates, NP   50 mg at 08/23/16 1129  . ibuprofen (ADVIL,MOTRIN) tablet 800 mg  800 mg Oral Q6H PRN Encarnacion Slates, NP   800 mg at 08/23/16 1517  . Influenza vac split quadrivalent PF (FLUARIX) injection 0.5 mL  0.5 mL Intramuscular Tomorrow-1000 Fernando A Cobos, MD      . lidocaine (LIDODERM) 5 % 1 patch  1 patch Transdermal Q24H Jenne Campus, MD   1 patch at 08/23/16 [id number]  . LORazepam (ATIVAN) tablet 0.5 mg  0.5 mg Oral Q8H PRN Jenne Campus, MD   0.5 mg at 08/22/16 1925  . magnesium hydroxide (MILK OF MAGNESIA) suspension 30 mL  30 mL Oral Daily PRN Patrecia Pour, NP      . metFORMIN (GLUCOPHAGE) tablet 500 mg  500 mg Oral BID WC Patrecia Pour, NP   500 mg at 08/23/16 0820  . methocarbamol (ROBAXIN) tablet 500 mg  500 mg Oral Q6H PRN Jenne Campus, MD   500 mg at 08/23/16 1517  . nicotine polacrilex (NICORETTE) gum 2 mg  2 mg Oral PRN Jenne Campus, MD   2 mg at 08/23/16 1131  . ondansetron (ZOFRAN) tablet 4 mg  4 mg Oral Q8H PRN Patrecia Pour, NP      . pantoprazole (PROTONIX) EC tablet 20 mg  20 mg Oral BID Jenne Campus, MD   20 mg at 08/23/16 0820  . propranolol (INDERAL) tablet 10 mg  10 mg Oral TID Encarnacion Slates, NP   10 mg at 08/23/16 1130  . QUEtiapine (SEROQUEL) tablet 200 mg  200 mg Oral QHS Jenne Campus, MD   200 mg at 08/22/16 2020  . QUEtiapine (SEROQUEL) tablet 50 mg  50 mg Oral BID Jenne Campus, MD   50 mg at 08/23/16 0820  . traZODone (DESYREL) tablet 50 mg  50 mg Oral QHS PRN Jenne Campus, MD   50 mg at 08/22/16 2020  . ziprasidone (GEODON) injection 20 mg  20 mg Intramuscular PRN Laverle Hobby, PA-C       Lab Results:  Results for orders placed or performed during the hospital encounter of 08/17/16 (from the past 48 hour(s))  Glucose, capillary     Status: Abnormal   Collection Time: 08/22/16  6:24 AM  Result  Value Ref Range   Glucose-Capillary 228 (H) 65 - 99 mg/dL  Carbamazepine level, total     Status: None   Collection Time: 08/22/16  6:46 AM  Result Value Ref Range   Carbamazepine Lvl 5.1 4.0 - 12.0 ug/mL    Comment: Performed at [facility]  Glucose, capillary     Status: Abnormal   Collection Time: 08/23/16  8:18 AM  Result Value Ref Range   Glucose-Capillary 184 (H) 65 - 99 mg/dL   Comment 1 Notify RN    Comment 2 Document in Chart     Blood Alcohol level:  Lab Results  Component Value Date   ETH <5 34/28/7681   Metabolic Disorder Labs: Lab Results  Component Value Date   HGBA1C 10.7 (H) 08/20/2016   MPG 260 08/20/2016   MPG 275 08/19/2016   Lab Results  Component Value Date   PROLACTIN 10.7 08/20/2016   Lab Results  Component Value Date   CHOL 218 (H) 08/19/2016   TRIG 192 (H) 08/19/2016   HDL 37 (L) 08/19/2016   CHOLHDL 5.9 08/19/2016   VLDL 38 08/19/2016   LDLCALC 143 (H) 08/19/2016   Physical Findings: AIMS: Facial and Oral Movements Muscles of Facial Expression: None, normal Lips and Perioral Area: None, normal Jaw: None, normal Tongue: None, normal,Extremity Movements Upper (arms, wrists, hands, fingers): None, normal Lower (legs, knees, ankles, toes): None, normal, Trunk Movements Neck, shoulders, hips: None, normal, Overall Severity Severity of abnormal movements (highest score from questions above): None, normal Incapacitation due to abnormal movements: None, normal Patient's awareness of abnormal movements (rate only patient's report): No Awareness, Dental Status Current problems with teeth and/or dentures?: No Does patient usually wear dentures?: No  CIWA:    COWS:     Musculoskeletal: Strength & Muscle Tone: within normal limits Gait & Station: normal Patient leans: N/A  Psychiatric Specialty Exam: Physical Exam: See H&P  Review of Systems  Psychiatric/Behavioral: Positive for depression (increased tearfulness) and substance  abuse (Hx, Cannabis use disorder). Negative for hallucinations, memory loss and suicidal ideas. The patient is nervous/anxious and has insomnia.   reports chronic pain, affecting legs and back  Blood pressure (!) 146/84, pulse 68, temperature 97.7 F (36.5 C), temperature source Oral, resp. rate 16, height _0  (1.854 m), weight 230 lb (104.3 kg), SpO2 96 %.Body mass index is 30.34 kg/m.  General Appearance:  fairly groomed   Eye Contact:  Good  Speech:  Normal   Volume:  Normal- not pressured   Mood:  depressed, labile   Affect: labile , intermittently irritable   Thought Process:  Linear   Orientation:  Full (Time, Place, and Person)  Thought Content:  linear , no flight of ideations noted   Suicidal Thoughts: currently denies  suicidal ideations, denies any self injurious ideations,  contracts for safety on unit   Homicidal Thoughts: denies plan or intention of HI - specifically denies any plan of  Violence or of hurting his GF, but makes statements such as " she better not destroy my stuff ".   .   Memory:  recent and remote grossly intact   Judgement:  Fair  Insight:  Fair  Psychomotor Activity:  No restlessness at this time , able to sit calmly during session   Concentration:  Concentration: good  and Attention Span: good   Recall: good   Fund of Knowledge:  good  Language:  good  Akathisia:  No  Handed:  Right  AIMS (if indicated):     Assets:  Communication Skills Desire for Improvement  ADL's:  Intact  Cognition:  WNL  Sleep:  Number of Hours: 5.5      Assessment - patient remains irritable, labile,intermittently  agitated , in spite of current medication regimen, which includes Tegretol and Seroquel. At this time would consider switching from Tegretol to Depakote ER, which he has not been on before. Patient agrees  Treatment Plan Summary: Daily contact with patient to assess and evaluate symptoms and progress in treatment and Medication management: Bipolar disorder:  Will  Seroquel to 50  mgrs BID and 200 mgrs QHS  Mood instability: D/C Tegretol, start Depakote ER 500 mgrs QDAY initially- side effects and rationale reviewed  Agitation/pain: Will continue Gabapentin to 600 mg QID. Will continue Lidoderm patch to help address chronic back pain High anxiety levels:  Continue Ativan 0.5 mgrs Q 6 hours PRN for anxiety as needed   High anxiety: Continue  Propranolol 10 mg TID  Insomnia: Continue Trazodone 50  mgrs QHS PRN for insomnia . Treatment team working on disposition planning options     Neita Garnet, MD 08/23/2016, 3:37 PMPatient ID: Charyl Bigger, male   DOB: 1976-09-27, 40 y.o.   MRN: [id number]

## 2016-08-23 NOTE — B[facility] Group Notes (Addendum)
The focus of this group is to educate the patient on the purpose and policies of crisis stabilization and provide a format to answer questions about their admission.  The group details unit policies and expectations of patients while admitted.  Patient attended 0900 nurse education orientation group this morning.  Patient actively participated and had appropriate affect.  Patient was alert.  Patient had appropriate insight and appropriate engagement.  Today patient will work on 3 goals for discharge.  Will work on breathing techniques for stress/ relaxation.

## 2016-08-23 NOTE — Progress Notes (Signed)
Mitchell George was petitioned last night via [employer] while inpatient due to an incident that occurred on the unit in which [employer] was called. Per my conversation with Dr. Jama Flavorsobos this morning concerning Mitchell George having an IV status, he states that he would like for Mitchell George to remain as Vol. Writer informed charge nurse of doctor's request and a new voluntary consent form was completed. Commitment Change form also completed by Clinical research associatewriter, signed by MD and placed in patient chart.

## 2016-08-24 LAB — GLUCOSE, CAPILLARY: GLUCOSE-CAPILLARY: 193 mg/dL — AB (ref 65–99)

## 2016-08-24 MED ORDER — LORAZEPAM 2 MG/ML IJ SOLN: 1.0000 mg | Freq: Three times a day (TID) | INTRAMUSCULAR | Status: DC | PRN

## 2016-08-24 MED ORDER — IBUPROFEN 800 MG PO TABS
800.0000 mg | ORAL_TABLET | Freq: Four times a day (QID) | ORAL | Status: DC | PRN
  Administered 2016-08-25 – 2016-08-27 (×7): 800 mg via ORAL
  Filled 2016-08-24 (×8): qty 1

## 2016-08-24 MED ORDER — LORAZEPAM 2 MG/ML IJ SOLN: 1.0000 mg | Freq: Once | INTRAMUSCULAR | Status: DC

## 2016-08-24 MED ORDER — LORAZEPAM 2 MG/ML IJ SOLN
INTRAMUSCULAR | Status: AC
  Filled 2016-08-24: qty 1

## 2016-08-24 MED ORDER — DIPHENHYDRAMINE HCL 50 MG/ML IJ SOLN
50.0000 mg | Freq: Once | INTRAMUSCULAR | Status: DC
  Filled 2016-08-24 (×2): qty 1

## 2016-08-24 MED ORDER — ZIPRASIDONE MESYLATE 20 MG IM SOLR: 20.0000 mg | Freq: Two times a day (BID) | INTRAMUSCULAR | Status: DC | PRN

## 2016-08-24 MED ORDER — DIVALPROEX SODIUM ER 500 MG PO TB24
1000.0000 mg | ORAL_TABLET | Freq: Every day | ORAL | Status: DC
  Administered 2016-08-24 – 2016-08-26 (×3): 1000 mg via ORAL
  Filled 2016-08-24 (×3): qty 2
  Filled 2016-08-24: qty 14
  Filled 2016-08-24 (×2): qty 2

## 2016-08-24 MED ORDER — LORAZEPAM 2 MG/ML IJ SOLN
1.0000 mg | Freq: Once | INTRAMUSCULAR | Status: AC
  Administered 2016-08-24: 1 mg via INTRAMUSCULAR

## 2016-08-24 MED ORDER — LORAZEPAM 1 MG PO TABS
1.0000 mg | ORAL_TABLET | Freq: Three times a day (TID) | ORAL | Status: DC | PRN
  Administered 2016-08-25 – 2016-08-26 (×5): 1 mg via ORAL
  Filled 2016-08-24 (×5): qty 1

## 2016-08-24 NOTE — Progress Notes (Addendum)
[facility] MD Progress Note  08/24/2016 12:33 PM JESON CAMACHO  MRN:  [id number]  Subjective:  Patient agitated , angry this morning , making threatening statements Objective: I have discussed case with treatment team and have met with patient . Patient became increasingly agitated this AM. This was apparently triggered by a phone call with GF- he expresses concern that his GF is going to destroy or ruin his expensive musical equipment. Patient continued to escalate in spite of efforts from Probation officer and Nursing staff - made threatening statements, stating " either you let me go right now, or I will leave in a body bag" " I am going to bust through the door and anyone who gets in my way is going to have a bad day " " I will get the cops to shoot me when they come". He denied any HI towards GF , but stated " she better not mess with my equipment or she is really going to regret it ". During conversations with patient efforts at verbal de-escalation were not successful. He repeatedly made statements about having been in many fights during his life and " knowing how to hurt people when I need to".  Although irritable, he also remained labile , and was briefly  tearful at times, loud and angry at others .  Because of the above, IVC commitment process was started , and patient was given IM  PRN medication for acute agitation ( Geodon and Ativan) . Staff and [employer] at scene. Thus far tolerating medications well, denies medication side effects. Of note, I have also  discussed case with Dr. Shea Evans, who also met with patient, in order to get second opinion and discuss best management     Principal Problem: Bipolar 1 disorder, depressed, severe Diagnosis:   Patient Active Problem List   Diagnosis Date Noted  . Bipolar disorder, current episode mixed, moderate ([place]) [F31.62]   . Bipolar 1 disorder, depressed, severe ([place]) [F31.4] 08/17/2016  . PTSD (post-traumatic stress disorder) [F43.10] 08/17/2016  . Affective  psychosis, bipolar ([place]) [F31.9] 08/17/2016   Total Time spent with patient: 40  minutes   Past Psychiatric History: PTSD, Bipolar 1 disorder.  Past Medical History:  Past Medical History:  Diagnosis Date  . Bipolar 1 disorder ([place])   . Diabetes mellitus   . PTSD (post-traumatic stress disorder)   . Schizophrenia [facility])     Past Surgical History:  Procedure Laterality Date  . HAND SURGERY    . KNEE SURGERY     Family History: History reviewed. No pertinent family history. Family Psychiatric  History: See H&P  Social History:  History  Alcohol Use  . Yes     History  Drug Use  . Types: Marijuana    Social History   Social History  . Marital status: Single    Spouse name: N/A  . Number of children: N/A  . Years of education: N/A   Social History Main Topics  . Smoking status: Current Every Day Smoker    Packs/day: 1.00  . Smokeless tobacco: Never Used  . Alcohol use Yes  . Drug use:     Types: Marijuana  . Sexual activity: Not Asked   Other Topics Concern  . None   Social History Narrative  . None   Additional Social History:    Sleep:  Fair   Appetite:  Fair  Current Medications: Current Facility-Administered Medications  Medication Dose Route Frequency Provider Last Rate Last Dose  . alum & mag  hydroxide-simeth (MAALOX/MYLANTA) 200-200-20 MG/5ML suspension 30 mL  30 mL Oral PRN Patrecia Pour, NP   30 mL at 08/22/16 1118  . atorvastatin (LIPITOR) tablet 40 mg  40 mg Oral q1800 Encarnacion Slates, NP   40 mg at 08/23/16 1641  . diphenhydrAMINE (BENADRYL) injection 50 mg  50 mg Intramuscular Once Jenne Campus, MD      . divalproex (DEPAKOTE ER) 24 hr tablet 500 mg  500 mg Oral Daily Jenne Campus, MD   500 mg at 08/24/16 0740  . gabapentin (NEURONTIN) capsule 600 mg  600 mg Oral QID Encarnacion Slates, NP   600 mg at 08/24/16 0740  . hydrOXYzine (ATARAX/VISTARIL) tablet 50 mg  50 mg Oral Q6H PRN Encarnacion Slates, NP   50 mg at 08/24/16 0934  . ibuprofen  (ADVIL,MOTRIN) tablet 800 mg  800 mg Oral Q6H PRN Encarnacion Slates, NP   800 mg at 08/24/16 0746  . Influenza vac split quadrivalent PF (FLUARIX) injection 0.5 mL  0.5 mL Intramuscular Tomorrow-1000 Fernando A Cobos, MD      . lidocaine (LIDODERM) 5 % 1 patch  1 patch Transdermal Q24H Jenne Campus, MD   1 patch at 08/24/16 0739  . LORazepam (ATIVAN) injection 1 mg  1 mg Intravenous Once Jenne Campus, MD      . LORazepam (ATIVAN) injection 1 mg  1 mg Intramuscular Once Jenne Campus, MD      . LORazepam (ATIVAN) tablet 0.5 mg  0.5 mg Oral Q8H PRN Jenne Campus, MD   0.5 mg at 08/23/16 2206  . magnesium hydroxide (MILK OF MAGNESIA) suspension 30 mL  30 mL Oral Daily PRN Patrecia Pour, NP      . metFORMIN (GLUCOPHAGE) tablet 500 mg  500 mg Oral BID WC Patrecia Pour, NP   500 mg at 08/24/16 0741  . methocarbamol (ROBAXIN) tablet 500 mg  500 mg Oral Q6H PRN Jenne Campus, MD   500 mg at 08/24/16 0746  . nicotine polacrilex (NICORETTE) gum 2 mg  2 mg Oral PRN Jenne Campus, MD   2 mg at 08/24/16 1019  . ondansetron (ZOFRAN) tablet 4 mg  4 mg Oral Q8H PRN Patrecia Pour, NP      . pantoprazole (PROTONIX) EC tablet 20 mg  20 mg Oral BID Jenne Campus, MD   20 mg at 08/24/16 0741  . propranolol (INDERAL) tablet 10 mg  10 mg Oral TID Encarnacion Slates, NP   10 mg at 08/24/16 0741  . QUEtiapine (SEROQUEL) tablet 300 mg  300 mg Oral QHS Jenne Campus, MD   300 mg at 08/23/16 2159  . QUEtiapine (SEROQUEL) tablet 50 mg  50 mg Oral BID Jenne Campus, MD   50 mg at 08/24/16 0740  . traZODone (DESYREL) tablet 50 mg  50 mg Oral QHS PRN Jenne Campus, MD   50 mg at 08/23/16 2206  . ziprasidone (GEODON) injection 20 mg  20 mg Intramuscular PRN Laverle Hobby, PA-C       Lab Results:  Results for orders placed or performed during the hospital encounter of 08/17/16 (from the past 48 hour(s))  Glucose, capillary     Status: Abnormal   Collection Time: 08/23/16  8:18 AM  Result Value Ref  Range   Glucose-Capillary 184 (H) 65 - 99 mg/dL   Comment 1 Notify RN    Comment 2 Document in Chart  Glucose, capillary     Status: Abnormal   Collection Time: 08/24/16  5:54 AM  Result Value Ref Range   Glucose-Capillary 193 (H) 65 - 99 mg/dL   Comment 1 Notify RN    Comment 2 Document in Chart     Blood Alcohol level:  Lab Results  Component Value Date   ETH <5 74/94/4967   Metabolic Disorder Labs: Lab Results  Component Value Date   HGBA1C 10.7 (H) 08/20/2016   MPG 260 08/20/2016   MPG 275 08/19/2016   Lab Results  Component Value Date   PROLACTIN 10.7 08/20/2016   Lab Results  Component Value Date   CHOL 218 (H) 08/19/2016   TRIG 192 (H) 08/19/2016   HDL 37 (L) 08/19/2016   CHOLHDL 5.9 08/19/2016   VLDL 38 08/19/2016   LDLCALC 143 (H) 08/19/2016   Physical Findings: AIMS: Facial and Oral Movements Muscles of Facial Expression: None, normal Lips and Perioral Area: None, normal Jaw: None, normal Tongue: None, normal,Extremity Movements Upper (arms, wrists, hands, fingers): None, normal Lower (legs, knees, ankles, toes): None, normal, Trunk Movements Neck, shoulders, hips: None, normal, Overall Severity Severity of abnormal movements (highest score from questions above): None, normal Incapacitation due to abnormal movements: None, normal Patient's awareness of abnormal movements (rate only patient's report): No Awareness, Dental Status Current problems with teeth and/or dentures?: No Does patient usually wear dentures?: No  CIWA:    COWS:     Musculoskeletal: Strength & Muscle Tone: within normal limits Gait & Station: normal Patient leans: N/A  Psychiatric Specialty Exam: Physical Exam: See H&P  Review of Systems  Psychiatric/Behavioral: Positive for depression (increased tearfulness) and substance abuse (Hx, Cannabis use disorder). Negative for hallucinations, memory loss and suicidal ideas. The patient is nervous/anxious and has insomnia.   reports  chronic pain, affecting legs and back  Blood pressure 118/67, pulse 79, temperature 97.3 F (36.3 C), temperature source Oral, resp. rate 18, height 6' 1"  (1.854 m), weight 230 lb (104.3 kg), SpO2 96 %.Body mass index is 30.34 kg/m.  General Appearance:  fairly groomed   Eye Contact:  Good  Speech:  Normal   Volume: intermittently loud   Mood:  irritable, angry   Affect: labile , agitated   Thought Process:  Linear   Orientation:  Full (Time, Place, and Person)  Thought Content:  linear , no flight of ideations noted   Suicidal Thoughts: making suicidal statements such as reporting he wanted police to shoot him, and reporting he planned to stop eating so he would starve to death  Homicidal Thoughts:(+) threatening statements as above .   Memory:  recent and remote grossly intact   Judgement:  limited   Insight:  ,limited   Psychomotor Activity: today more restless, pacing  Concentration:  Concentration: good  and Attention Span: good   Recall: good   Fund of Knowledge:  good  Language:  good  Akathisia:  No  Handed:  Right  AIMS (if indicated):     Assets:  Communication Skills Desire for Improvement  ADL's:  Intact  Cognition:  WNL  Sleep:  Number of Hours: 5.5      Assessment - Today patient increasingly agitated, angry, irritable. This was triggered by phone conversation with GF and subsequent threats to elope, break down doors or windows to leave. Became loud, pacing, motorically agitated, and made suicidal threats, and threats of violence. As above, patient did not respond to verbal de-escalation and required PRN medications for acute agitation  (  Geodon, Ativan) . Insight into disruptive behavior remained  limited.  Although threatening and loud, he was  physically assaultive and eventually allowed PRN medications.    Treatment Plan Summary: Daily contact with patient to assess and evaluate symptoms and progress in treatment and Medication management: Bipolar disorder:  Will increase Seroquel  to 50  mgrs BID and 300  mgrs QHS  Mood instability: Will increase Depakote ER to 1000  mgrs  QHS   Agitation/pain: Will continue Gabapentin to 600 mg QID. Will continue Lidoderm patch to help address chronic back pain Anxiety:  Continue Ativan 0.5 mgrs Q 6 hours PRN for anxiety as needed  .  Continue  Propranolol 10 mg TID  Insomnia: Continue Trazodone 50  mgrs QHS PRN for insomnia . Treatment team working on disposition planning options  * Patient's status has been changed to involuntary/IVCd     Neita Garnet, MD 08/24/2016, 12:33 PMPatient ID: Charyl Bigger, male   DOB: 05-23-1976, 41 y.o.   MRN: [id number]

## 2016-08-24 NOTE — Progress Notes (Signed)
  Was requested by Dr.Cobos to assess patient for agitation and him requesting to be discharged. Met with patient along with Dr.Cobos. Pt refused to sit down for an evaluation , rather appeared to be irritable, anxious and agitated . Pt with pressured speech , was focussed on his 'stuff' getting lost . Pt appeared to lack insight in to his illness, his treatment plan, his medications . Pt appeared to be impulsive and easily agitated and could not be verbally de- escalated . Pt was offered medications by Dr.Cobos. Pt however kept going back to wanting to be discharged. When writer asked patient what he would do if he gets irritable at something or some one once  he were to be discharged today - pt responded by stating : I will fight  , I will hurt . I have a hx of fighting ."  Patient at the time of assessment - has a high acute risk of violence - based on his current presentation - he has mental illness as well as co- existing substance abuse , he is not very compliant on his medications , lacks insight and judgement , is impulsive , aggressive and disruptive on the unit , has a hx of being aggressive and getting in to fights , has a hx of legal problems.  Patient hence continues to be a danger to self or others based on the above factors , will continue to need inpatient stay.   Ursula Alert ,MD Attending [facility]

## 2016-08-24 NOTE — B[facility] Group Notes (Signed)
Type of Therapy:  Group therapy  Participation Level:  Active  Participation Quality:  Attentive  Affect:  Flat  Cognitive:  Oriented  Insight:  Limited  Engagement in Therapy:  Limited  Modes of Intervention:  Discussion, Socialization  Summary of Progress/Problems:  Chaplain was here to lead a group on themes of hope and courage. Invited, chose not to attend.   

## 2016-08-24 NOTE — Progress Notes (Signed)
D: Pt denies SI/HI/AVH. Pt is pleasant and cooperative. Pt goal for today is to work on his anger. A: Pt was offered support and encouragement. Pt was given scheduled medications. Pt was encourage to attend groups. Q 15 minute checks were done for safety.  R:Pt attends groups and interacts well with peers and staff. Pt is taking medication. Pt has no complaints.Pt receptive to treatment and safety maintained on unit.

## 2016-08-24 NOTE — Tx Team (Signed)
Interdisciplinary Treatment and Diagnostic Plan Update  08/24/2016 Time of Session: 10:55 AM  ANDREA COLGLAZIER MRN: [id number]  Principal Diagnosis: Bipolar 1 Disorder  Secondary Diagnoses: Active Problems:   Affective psychosis, bipolar (HCC)   Bipolar disorder, current episode mixed, moderate (HCC)   Current Medications:  Current Facility-Administered Medications  Medication Dose Route Frequency Provider Last Rate Last Dose  . alum & mag hydroxide-simeth (MAALOX/MYLANTA) 200-200-20 MG/5ML suspension 30 mL  30 mL Oral PRN Charm Rings, NP   30 mL at 08/22/16 1118  . atorvastatin (LIPITOR) tablet 40 mg  40 mg Oral q1800 Sanjuana Kava, NP   40 mg at 08/23/16 1641  . divalproex (DEPAKOTE ER) 24 hr tablet 500 mg  500 mg Oral Daily Craige Cotta, MD   500 mg at 08/24/16 0740  . gabapentin (NEURONTIN) capsule 600 mg  600 mg Oral QID Sanjuana Kava, NP   600 mg at 08/24/16 0740  . hydrOXYzine (ATARAX/VISTARIL) tablet 50 mg  50 mg Oral Q6H PRN Sanjuana Kava, NP   50 mg at 08/24/16 0934  . ibuprofen (ADVIL,MOTRIN) tablet 800 mg  800 mg Oral Q6H PRN Sanjuana Kava, NP   800 mg at 08/24/16 0746  . Influenza vac split quadrivalent PF (FLUARIX) injection 0.5 mL  0.5 mL Intramuscular Tomorrow-1000 Fernando A Cobos, MD      . lidocaine (LIDODERM) 5 % 1 patch  1 patch Transdermal Q24H Craige Cotta, MD   1 patch at 08/24/16 0739  . LORazepam (ATIVAN) tablet 0.5 mg  0.5 mg Oral Q8H PRN Craige Cotta, MD   0.5 mg at 08/23/16 2206  . magnesium hydroxide (MILK OF MAGNESIA) suspension 30 mL  30 mL Oral Daily PRN Charm Rings, NP      . metFORMIN (GLUCOPHAGE) tablet 500 mg  500 mg Oral BID WC Charm Rings, NP   500 mg at 08/24/16 0741  . methocarbamol (ROBAXIN) tablet 500 mg  500 mg Oral Q6H PRN Craige Cotta, MD   500 mg at 08/24/16 0746  . nicotine polacrilex (NICORETTE) gum 2 mg  2 mg Oral PRN Craige Cotta, MD   2 mg at 08/24/16 1019  . ondansetron (ZOFRAN) tablet 4 mg  4 mg Oral Q8H PRN  Charm Rings, NP      . pantoprazole (PROTONIX) EC tablet 20 mg  20 mg Oral BID Craige Cotta, MD   20 mg at 08/24/16 0741  . propranolol (INDERAL) tablet 10 mg  10 mg Oral TID Sanjuana Kava, NP   10 mg at 08/24/16 0741  . QUEtiapine (SEROQUEL) tablet 300 mg  300 mg Oral QHS Craige Cotta, MD   300 mg at 08/23/16 2159  . QUEtiapine (SEROQUEL) tablet 50 mg  50 mg Oral BID Craige Cotta, MD   50 mg at 08/24/16 0740  . traZODone (DESYREL) tablet 50 mg  50 mg Oral QHS PRN Craige Cotta, MD   50 mg at 08/23/16 2206  . ziprasidone (GEODON) injection 20 mg  20 mg Intramuscular PRN Kerry Hough, PA-C       PTA Medications: Prescriptions Prior to Admission  Medication Sig Dispense Refill Last Dose  . diclofenac (VOLTAREN) 75 MG EC tablet TAKE 1 TABLET BY MOUTH TWICE A DAY (Patient not taking: Reported on 08/16/2016) 120 tablet 2 Not Taking at Unknown time  . diclofenac (VOLTAREN) 75 MG EC tablet TAKE 1 TABLET BY MOUTH TWICE A DAY (Patient  not taking: Reported on 08/16/2016) 60 tablet 2 Not Taking at Unknown time  . ibuprofen (ADVIL,MOTRIN) 600 MG tablet Take 1 tablet (600 mg total) by mouth every 6 (six) hours as needed. (Patient not taking: Reported on 08/16/2016) 30 tablet 0 Not Taking at Unknown time  . methocarbamol (ROBAXIN) 500 MG tablet Take 2 tablets (1,000 mg total) by mouth 4 (four) times daily as needed (Pain). (Patient not taking: Reported on 08/16/2016) 20 tablet 0 Not Taking at Unknown time  . sulfamethoxazole-trimethoprim (SEPTRA DS) 800-160 MG per tablet Take 1 tablet by mouth every 12 (twelve) hours. (Patient not taking: Reported on 08/16/2016) 14 tablet 0 Not Taking at Unknown time  . traZODone (DESYREL) 150 MG tablet Take 150-300 mg by mouth at bedtime.   Past Week at Unknown time    Patient Stressors: Financial difficulties Health problems Medication change or noncompliance Substance abuse  Patient Strengths: Average or above average intelligence Communication  skills Supportive family/friends  Treatment Modalities: Medication Management, Group therapy, Case management,  1 to 1 session with clinician, Psychoeducation, Recreational therapy.   Physician Treatment Plan for Primary Diagnosis: <principal problem not specified> Long Term Goal(s): Improvement in symptoms so as ready for discharge Improvement in symptoms so as ready for discharge   Short Term Goals: Ability to verbalize feelings will improve Ability to disclose and discuss suicidal ideas Ability to identify and develop effective coping behaviors will improve Ability to identify triggers associated with substance abuse/mental health issues will improve Ability to identify changes in lifestyle to reduce recurrence of condition will improve Ability to verbalize feelings will improve Compliance with prescribed medications will improve  Medication Management: Evaluate patient's response, side effects, and tolerance of medication regimen.  Therapeutic Interventions: 1 to 1 sessions, Unit Group sessions and Medication administration.  Evaluation of Outcomes: Progressing  Physician Treatment Plan for Secondary Diagnosis: Active Problems:   Affective psychosis, bipolar (HCC)   Bipolar disorder, current episode mixed, moderate (HCC)  Long Term Goal(s): Improvement in symptoms so as ready for discharge Improvement in symptoms so as ready for discharge   Short Term Goals: Ability to verbalize feelings will improve Ability to disclose and discuss suicidal ideas Ability to identify and develop effective coping behaviors will improve Ability to identify triggers associated with substance abuse/mental health issues will improve Ability to identify changes in lifestyle to reduce recurrence of condition will improve Ability to verbalize feelings will improve Compliance with prescribed medications will improve     Medication Management: Evaluate patient's response, side effects, and tolerance  of medication regimen.  Therapeutic Interventions: 1 to 1 sessions, Unit Group sessions and Medication administration.  Evaluation of Outcomes: Progressing   RN Treatment Plan for Primary Diagnosis: Bipolar 1 Disorder Long Term Goal(s): Knowledge of disease and therapeutic regimen to maintain health will improve  Short Term Goals: Ability to verbalize frustration and anger appropriately will improve and Ability to demonstrate self-control  Medication Management: RN will administer medications as ordered by provider, will assess and evaluate patient's response and provide education to patient for prescribed medication. RN will report any adverse and/or side effects to prescribing provider.  Therapeutic Interventions: 1 on 1 counseling sessions, Psychoeducation, Medication administration, Evaluate responses to treatment, Monitor vital signs and CBGs as ordered, Perform/monitor CIWA, COWS, AIMS and Fall Risk screenings as ordered, Perform wound care treatments as ordered.  Evaluation of Outcomes: Not Progressing   LCSW Treatment Plan for Primary Diagnosis: Bipolar 1 Disorder  Long Term Goal(s): Safe transition to appropriate next level of  care at discharge, Engage patient in therapeutic group addressing interpersonal concerns.  Short Term Goals: Engage patient in aftercare planning with referrals and resources, Increase emotional regulation and Identify triggers associated with mental health/substance abuse issues  Therapeutic Interventions: Assess for all discharge needs, 1 to 1 time with Social worker, Explore available resources and support systems, Assess for adequacy in community support network, Educate family and significant other(s) on suicide prevention, Complete Psychosocial Assessment, Interpersonal group therapy.  Evaluation of Outcomes: Progressing   Progress in Treatment: Attending groups: Yes. Participating in groups: Yes. Taking medication as prescribed: Yes. Toleration  medication: Yes. Family/Significant other contact made: Yes, individual(s) contacted:  with friend, Arline AspCindy Patient understands diagnosis: Yes. Discussing patient identified problems/goals with staff: Yes. Medical problems stabilized or resolved: Yes. Denies suicidal/homicidal ideation: No. and As evidenced by:  frequent expressions of anger towards others, including ex wife.  States he has "multiple personalities" and "flips" when angry Issues/concerns per patient self-inventory: No. Other: NA  New problem(s) identified: Yes, Describe:  anger management, medication stabilization  New Short Term/Long Term Goal(s):  Discharge Plan or Barriers:   Reason for Continuation of Hospitalization: Aggression Depression Medication stabilization  Estimated Length of Stay: 2-4 days  Attendees: Patient: 08/24/2016 10:55 AM  Physician: Sallyanne HaversF Cobos MD 08/24/2016 10:55 AM  Nursing: Johnnye Simaenise Lloyd, Patricia Duke RN 08/24/2016 10:55 AM  RN Care Manager: Sondra BargesJ Clark RN CM 08/24/2016 10:55 AM  Social Worker: Chad CordialLauren Carter, LCSW 08/24/2016 10:55 AM  Recreational Therapist:  08/24/2016 10:55 AM  Other:  08/24/2016 10:55 AM  Other:  08/24/2016 10:55 AM  Other: 08/24/2016 10:55 AM    Scribe for Treatment Team: Elaina HoopsLauren M Carter, LCSW 08/24/2016 10:55 AM

## 2016-08-24 NOTE — Progress Notes (Signed)
Patient A/O, no noted distress. Denies AVH/SI/HI at the time of assessment. Patient noted he did not sleep well. Patient was educated and administered meds, tolerated well. Patient called GF and became agitated during his phone conservation, CRN approached patient and ask to refrain from the phone call. Patient refrained from call. He paced the hall and went into the dayroom for a short period of time. Patient made another call to GF, became agitated again. Staff initiated deescalated techniques. Patient noted he wanted to be discharge to retrieve his belongings from his the home he shares with his girlfriend. Patient was IVC. Patient sat and agreed to allow nurses to administered meds and escorted to the 500 hall. Staff will continue to monitor, meet needs, and maintain safety.

## 2016-08-24 NOTE — Progress Notes (Signed)
Patient ID: Mitchell George, male   DOB: [birth year], 40 y.o.   MRN: [id number] D: client is visible on the unit, has visit with GF today "we trying to work things out, just getting back together" "I loved her all the time"  Client report pain lower back "8" of 10. Client makes references about people being mad with him or afraid of him because of a previous incident, that occurred since his admission. A: Writer provided emotional support, assured client that staff is here for support not to pass judgement. Medications reviewed, administered as ordered. Staff will monitor [id number] or safety. R: Client is safe on the unit, attended group.

## 2016-08-24 NOTE — Progress Notes (Signed)
Patient attended the evening group session and was attentive to all discussion prompts from the writer. Patient shared that his goal for the day was "to get out of here any way I can. "  This writer asked to patient to describe two ways to accomplish his goal. Patient stated that taking his medications like he should and to eat like he is supposed to will help him in getting discharged. Patient rated his day a 2 out of 10 and his affect was appropriate.

## 2016-08-24 NOTE — Progress Notes (Signed)
Recreation Therapy Notes  Date: 08/24/16 Time: 0930 Location: 300 Hall Dayroom  Group Topic: Stress Management  Goal Area(s) Addresses:  Patient will verbalize importance of using healthy stress management.  Patient will identify positive emotions associated with healthy stress management.   Intervention: Stress Management  Activity :  [facility].  LRT introduced the technique of guided imagery.  LRT read script allowing patients to participate and engage in the technique.  Patients were to follow along as LRT read script.  Education:  Stress Management, Discharge Planning.   Education Outcome: Acknowledges edcuation/In group clarification offered/Needs additional education  Clinical Observations/Feedback: Pt did not attend group.     Annai Heick, LRT/CTRS         Ajani Rineer A 08/24/2016 12:00 PM 

## 2016-08-25 LAB — GLUCOSE, CAPILLARY: GLUCOSE-CAPILLARY: 154 mg/dL — AB (ref 65–99)

## 2016-08-25 MED ORDER — METHOCARBAMOL 500 MG PO TABS
1000.0000 mg | ORAL_TABLET | Freq: Three times a day (TID) | ORAL | Status: DC | PRN
  Administered 2016-08-26 – 2016-08-27 (×4): 1000 mg via ORAL
  Filled 2016-08-25 (×4): qty 2

## 2016-08-25 NOTE — [facility] Group Notes (Signed)
[facility] Group Notes:  (Clinical Social Work)  08/25/2016  11:15-12:00PM  Summary of Progress/Problems:   Today's process group involved patients discussing their feelings related to being hospitalized, as well as how they can use their present feelings to create a goal for the day. The patient expressed his primary feeling about being hospitalized is currently that the staff is "liars and I don't trust any of them."  He added that he used to feel safe, but when he was moved to a different hall, he stopped feeling safe because the staff lied to get him there.  He said that he brought himself to the hospital because he was thinking of suicide, and that in the future if that thought occurs to him, he will never again seek a hospital's help, no matter if that means he were to end up proceeding with a suicide attempt.  He also stated that if he is not out of the hospital by his mother's surgery date on 09/21/16, he will make a very large problem for the hospital.  He could not hear CSW's assurances that it would be very untypical for someone to stay that long.  Type of Therapy:  Group Therapy - Process  Participation Level:  Active  Participation Quality:  Attentive  Affect:  Irritable  Cognitive:  Perseverating  Insight:  Limited  Engagement in Therapy:  Improving  Modes of Intervention:  Exploration, Discussion  Ambrose MantleMareida Grossman-Orr, LCSW 08/25/2016, 1:03 PM

## 2016-08-25 NOTE — Progress Notes (Signed)
Adult Psychoeducational Group Note  Date:  08/25/2016 Time:  8:45 PM  Group Topic/Focus:  Wrap-Up Group:   The focus of this group is to help patients review their daily goal of treatment and discuss progress on daily workbooks.   Participation Level:  Active  Participation Quality:  Appropriate  Affect:  Appropriate  Cognitive:  Appropriate  Insight: Appropriate  Engagement in Group:  Engaged  Modes of Intervention:  Discussion  Additional Comments: The patient expressed that he attended groups Octavio Mannshigpen, Shanti Agresti Lee 08/25/2016, 8:45 PM

## 2016-08-25 NOTE — Progress Notes (Signed)
DAR NOTE: Patient presents with anxious affect and depressed mood. Pt has been in the room most of time, pt stated he is staying away from anything that might make him anxious. Pt complained of back pain, denies auditory and visual hallucinations.  Rates depression at 4, hopelessness at 4, and anxiety at 4.  Maintained on routine safety checks.  Medications given as prescribed.  Support and encouragement offered as needed.  Attended group and participated.  States goal for today is " getting out of here ASAP."  Patient observed socializing with peers in the dayroom.  Offered no complaint.

## 2016-08-25 NOTE — Progress Notes (Signed)
[facility] MD Progress Note  08/25/2016 11:34 AM Mitchell George  MRN:  [id number]  Subjective:  Patient reports " I am more worried today than yesterday."  Objective: Mitchell George is awake, alert and oriented *3 , Seen resting in his bedroom. Denies suicidal or homicidal ideation. Patient was asked in particular homicidal ideation toward [place], patient denies thoughts or intent.  Patient has agree to not speak to girlfriend on the phone today. Denies auditory or visual hallucination and does not appear to be responding to internal stimuli. Patient reports interacting well with staff and others. Advised patient of internal medicine consult regarding elevated blood glucose levels.  Patient reports he is medication compliant without mediation side effects. Reports good appetite other wise and resting well. Support, encouragement and reassurance was provided.   Principal Problem: Bipolar 1 disorder, depressed, severe Diagnosis:   Patient Active Problem List   Diagnosis Date Noted  . Bipolar disorder, current episode mixed, moderate (HCC) [F31.62]   . Bipolar 1 disorder, depressed, severe (HCC) [F31.4] 08/17/2016  . PTSD (post-traumatic stress disorder) [F43.10] 08/17/2016  . Affective psychosis, bipolar (HCC) [F31.9] 08/17/2016   Total Time spent with patient: 40  minutes   Past Psychiatric History: PTSD, Bipolar 1 disorder.  Past Medical History:  Past Medical History:  Diagnosis Date  . Bipolar 1 disorder (HCC)   . Diabetes mellitus   . PTSD (post-traumatic stress disorder)   . Schizophrenia [facility])     Past Surgical History:  Procedure Laterality Date  . HAND SURGERY    . KNEE SURGERY     Family History: History reviewed. No pertinent family history. Family Psychiatric  History: See H&P  Social History:  History  Alcohol Use  . Yes     History  Drug Use  . Types: Marijuana    Social History   Social History  . Marital status: Single    Spouse name: N/A  . Number of  children: N/A  . Years of education: N/A   Social History Main Topics  . Smoking status: Current Every Day Smoker    Packs/day: 1.00  . Smokeless tobacco: Never Used  . Alcohol use Yes  . Drug use:     Types: Marijuana  . Sexual activity: Not Asked   Other Topics Concern  . None   Social History Narrative  . None   Additional Social History:    Sleep:  Fair   Appetite:  Fair  Current Medications: Current Facility-Administered Medications  Medication Dose Route Frequency Provider Last Rate Last Dose  . alum & mag hydroxide-simeth (MAALOX/MYLANTA) 200-200-20 MG/5ML suspension 30 mL  30 mL Oral PRN Charm Rings, NP   30 mL at 08/22/16 1118  . atorvastatin (LIPITOR) tablet 40 mg  40 mg Oral q1800 Sanjuana Kava, NP   40 mg at 08/24/16 1850  . diphenhydrAMINE (BENADRYL) injection 50 mg  50 mg Intramuscular Once Craige Cotta, MD      . divalproex (DEPAKOTE ER) 24 hr tablet 1,000 mg  1,000 mg Oral QHS Craige Cotta, MD   1,000 mg at 08/24/16 2124  . gabapentin (NEURONTIN) capsule 600 mg  600 mg Oral QID Sanjuana Kava, NP   600 mg at 08/25/16 0803  . hydrOXYzine (ATARAX/VISTARIL) tablet 50 mg  50 mg Oral Q6H PRN Sanjuana Kava, NP   50 mg at 08/25/16 0802  . ibuprofen (ADVIL,MOTRIN) tablet 800 mg  800 mg Oral Q6H PRN Jackelyn Poling, NP   800  mg at 08/25/16 0350  . Influenza vac split quadrivalent PF (FLUARIX) injection 0.5 mL  0.5 mL Intramuscular Tomorrow-1000 Fernando A Cobos, MD      . lidocaine (LIDODERM) 5 % 1 patch  1 patch Transdermal Q24H Craige CottaFernando A Cobos, MD   1 patch at 08/25/16 0804  . LORazepam (ATIVAN) tablet 1 mg  1 mg Oral Q8H PRN Craige CottaFernando A Cobos, MD   1 mg at 08/25/16 1011   Or  . LORazepam (ATIVAN) injection 1 mg  1 mg Intramuscular Q8H PRN Rockey SituFernando A Cobos, MD      . magnesium hydroxide (MILK OF MAGNESIA) suspension 30 mL  30 mL Oral Daily PRN Charm RingsJamison Y Lord, NP      . metFORMIN (GLUCOPHAGE) tablet 500 mg  500 mg Oral BID WC Charm RingsJamison Y Lord, NP   500 mg at  08/25/16 0804  . methocarbamol (ROBAXIN) tablet 500 mg  500 mg Oral Q6H PRN Craige CottaFernando A Cobos, MD   500 mg at 08/25/16 0350  . nicotine polacrilex (NICORETTE) gum 2 mg  2 mg Oral PRN Craige CottaFernando A Cobos, MD   2 mg at 08/25/16 1009  . ondansetron (ZOFRAN) tablet 4 mg  4 mg Oral Q8H PRN Charm RingsJamison Y Lord, NP      . pantoprazole (PROTONIX) EC tablet 20 mg  20 mg Oral BID Craige CottaFernando A Cobos, MD   20 mg at 08/25/16 0804  . propranolol (INDERAL) tablet 10 mg  10 mg Oral TID Sanjuana KavaAgnes I Nwoko, NP   10 mg at 08/25/16 0804  . QUEtiapine (SEROQUEL) tablet 300 mg  300 mg Oral QHS Craige CottaFernando A Cobos, MD   300 mg at 08/24/16 2128  . QUEtiapine (SEROQUEL) tablet 50 mg  50 mg Oral BID Craige CottaFernando A Cobos, MD   50 mg at 08/25/16 0804  . traZODone (DESYREL) tablet 50 mg  50 mg Oral QHS PRN Craige CottaFernando A Cobos, MD   50 mg at 08/24/16 2126  . ziprasidone (GEODON) injection 20 mg  20 mg Intramuscular Q12H PRN Craige CottaFernando A Cobos, MD       Lab Results:  Results for orders placed or performed during the hospital encounter of 08/17/16 (from the past 48 hour(s))  Glucose, capillary     Status: Abnormal   Collection Time: 08/24/16  5:54 AM  Result Value Ref Range   Glucose-Capillary 193 (H) 65 - 99 mg/dL   Comment 1 Notify RN    Comment 2 Document in Chart   Glucose, capillary     Status: Abnormal   Collection Time: 08/25/16  6:15 AM  Result Value Ref Range   Glucose-Capillary 154 (H) 65 - 99 mg/dL    Blood Alcohol level:  Lab Results  Component Value Date   ETH <5 08/16/2016   Metabolic Disorder Labs: Lab Results  Component Value Date   HGBA1C 10.7 (H) 08/20/2016   MPG 260 08/20/2016   MPG 275 08/19/2016   Lab Results  Component Value Date   PROLACTIN 10.7 08/20/2016   Lab Results  Component Value Date   CHOL 218 (H) 08/19/2016   TRIG 192 (H) 08/19/2016   HDL 37 (L) 08/19/2016   CHOLHDL 5.9 08/19/2016   VLDL 38 08/19/2016   LDLCALC 143 (H) 08/19/2016   Physical Findings: AIMS: Facial and Oral Movements Muscles  of Facial Expression: None, normal Lips and Perioral Area: None, normal Jaw: None, normal Tongue: None, normal,Extremity Movements Upper (arms, wrists, hands, fingers): None, normal Lower (legs, knees, ankles, toes): None, normal, Trunk Movements  Neck, shoulders, hips: None, normal, Overall Severity Severity of abnormal movements (highest score from questions above): None, normal Incapacitation due to abnormal movements: None, normal Patient's awareness of abnormal movements (rate only patient's report): No Awareness, Dental Status Current problems with teeth and/or dentures?: No Does patient usually wear dentures?: No  CIWA:    COWS:     Musculoskeletal: Strength & Muscle Tone: within normal limits Gait & Station: normal Patient leans: N/A  Psychiatric Specialty Exam: Physical Exam  Nursing note and vitals reviewed. Constitutional: He is oriented to person, place, and time. He appears well-developed.  Cardiovascular: Normal rate and regular rhythm.   Musculoskeletal: Normal range of motion.  Neurological: He is alert and oriented to person, place, and time.  Psychiatric: He has a normal mood and affect. His behavior is normal.  : See H&P  Review of Systems  Psychiatric/Behavioral: Positive for depression (increased tearfulness) and substance abuse (Hx, Cannabis use disorder). Negative for hallucinations, memory loss and suicidal ideas. The patient is nervous/anxious and has insomnia.   reports chronic pain, affecting legs and back  Blood pressure 133/75, pulse 69, temperature 97.8 F (36.6 C), temperature source Oral, resp. rate 18, height 6\' 1"  (1.854 m), weight 104.3 kg (230 lb), SpO2 96 %.Body mass index is 30.34 kg/m.  General Appearance:  fairly groomed   Eye Contact:  Good  Speech:  Normal   Volume: WNL  Mood: pleasant and clame  Affect: labile, gaureded  Thought Process:  Linear   Orientation:  Full (Time, Place, and Person)  Thought Content:  linear , no flight of  ideations noted   Suicidal Thoughts: making suicidal statements such as reporting he wanted police to shoot him, and reporting he planned to stop eating so he would starve to death (deneis during this assessment)  Homicidal Thoughts:(+) threatening statements as above - patient denies homicidal ideation toward girlfriend today.   Memory:  recent and remote grossly intact   Judgement:  limited   Insight:  ,limited   Psychomotor Activity: Restlessness- improving   Concentration:  Concentration: good  and Attention Span: good   Recall: good   Fund of Knowledge:  good  Language:  good  Akathisia:  No  Handed:  Right  AIMS (if indicated):     Assets:  Communication Skills Desire for Improvement  ADL's:  Intact  Cognition:  WNL  Sleep:  Number of Hours: 5.5       I agree with current treatment plan on 08/25/2016, Patient seen face-to-face for psychiatric evaluation follow-up, chart reviewed.Reviewed the information documented and agree with the treatment plan.  Treatment Plan Summary: Daily contact with patient to assess and evaluate symptoms and progress in treatment and Medication management:  Internal medicine consult orders placed for elevated CBG's Bipolar disorder: Will contiune Seroquel  to 50  mgrs BID and 300  mgrs QHS  Mood instability: Will continue Depakote ER to 1000  mgrs  QHS   Agitation/pain: Will continue Gabapentin to 600 mg QID. Will continue Lidoderm patch to help address chronic back pain Anxiety:  Continue Ativan 0.5 mgrs Q 6 hours PRN for anxiety as needed  .  Continue  Propranolol 10 mg TID  Insomnia: Continue Trazodone 50  mgrs QHS PRN for insomnia . Treatment team working on disposition planning options  * Patient's status has been changed to involuntary/IVCd    Oneta Rack, NP 08/25/2016, 11:34 AM   Agree with NP note,assessment

## 2016-08-25 NOTE — Plan of Care (Signed)
Problem: Activity: Goal: Interest or engagement in activities will improve Outcome: Progressing Client interest in activities will improve AEB attending groups, showing interest in self help activities. Client colors to occupy self, also attended karaoke and sang several songs. "I'm going to color before I go to sleep"

## 2016-08-26 LAB — GLUCOSE, CAPILLARY
GLUCOSE-CAPILLARY: 184 mg/dL — AB (ref 65–99)
GLUCOSE-CAPILLARY: 236 mg/dL — AB (ref 65–99)
Glucose-Capillary: 219 mg/dL — ABNORMAL HIGH (ref 65–99)

## 2016-08-26 MED ORDER — KETOROLAC TROMETHAMINE 60 MG/2ML IM SOLN
INTRAMUSCULAR | Status: AC
  Filled 2016-08-26: qty 2

## 2016-08-26 MED ORDER — TRAMADOL HCL 50 MG PO TABS: 100.0000 mg | ORAL_TABLET | Freq: Once | ORAL | Status: DC

## 2016-08-26 MED ORDER — LORAZEPAM 1 MG PO TABS
ORAL_TABLET | ORAL | Status: AC
  Filled 2016-08-26: qty 2

## 2016-08-26 MED ORDER — LORAZEPAM 1 MG PO TABS
2.0000 mg | ORAL_TABLET | Freq: Once | ORAL | Status: AC
  Administered 2016-08-26: 2 mg via ORAL

## 2016-08-26 MED ORDER — KETOROLAC TROMETHAMINE 60 MG/2ML IM SOLN
60.0000 mg | Freq: Once | INTRAMUSCULAR | Status: AC
  Administered 2016-08-26: 60 mg via INTRAMUSCULAR
  Filled 2016-08-26: qty 2

## 2016-08-26 MED ORDER — LORAZEPAM 1 MG PO TABS: 1.0000 mg | ORAL_TABLET | Freq: Once | ORAL | Status: AC

## 2016-08-26 MED ORDER — LORAZEPAM 2 MG/ML IJ SOLN
1.0000 mg | Freq: Once | INTRAMUSCULAR | Status: AC
  Administered 2016-08-26: 1 mg via INTRAMUSCULAR
  Filled 2016-08-26: qty 1

## 2016-08-26 NOTE — Progress Notes (Addendum)
Patient is in day room with MHT, patient is agitated and yelling at MHT. Patient is requesting something stronger for pain. Reviewed H&P, meds, and labs. Provided emotional support and ordered one time doses of ativan and toradol. Accompanied patient back to his room with nursing staff. Patient resting quietly.

## 2016-08-26 NOTE — Progress Notes (Signed)
DAR Note: Pt present with depressed and anxious mood on the unit. Pt continues to complain about his pain not being well managed. Pt stated that Robaxin and Advil are not pain medication and asking to be sent to the ED for stronger pain med. Pt stated he would rather be discharged because at home, his home boys give him "some shit" that controls his pain well. As per self inventory, pt had a good night sleep, fair appetite, low energy and poor concentration. Pt rates depression at 4, hopelessness at 4, and anxiety at 9. Medication is given as prescribed, support and encouragement offered. Will continue to monitor.

## 2016-08-26 NOTE — [facility] Group Notes (Signed)
[facility] Group Notes:  (Clinical Social Work)  08/26/2016  11:00AM-12:00PM  Summary of Progress/Problems:  The main focus of today's process group was to listen to a variety of genres of music and to identify that different types of music provoke different responses.  The patient then was able to identify personally what was soothing for them, as well as energizing, as well as how patient can personally use this knowledge in sleep habits, with depression, and with other symptoms.  The patient expressed at the beginning of group that today [place]/Country helps him a lot.  He requested that CSW play a song that he sings on YouTube, which was done.  He enjoyed group a great deal.  Type of Therapy:  Music Therapy   Participation Level:  Active  Participation Quality:  Attentive and Sharing  Affect:  Blunted  Cognitive:  Oriented  Insight:  Engaged  Engagement in Therapy:  Engaged  Modes of Intervention:   Activity, Exploration  Ambrose MantleMareida Grossman-Orr, LCSW 08/26/2016

## 2016-08-26 NOTE — Progress Notes (Signed)
Patient ID: Mitchell George, male   DOB: [birth year], 40 y.o.   MRN: [id number] D: Client has visit from GF tonight. Client continues to report excruciating pain, but seen relaxing with GF and later in dayroom watching TV eating ice-cream. Client is very demanding and labile at one point slamming doors angry with MHT during group walked out,  because MHT attempted to appease him by  Telling " him we would keep door open not to awake him during q7615mn safety checks. "I don't want my door open, I don't like the light, I don't care if it makes noise when it's opened." Client exhibiting very childish behavior when things not done exactly as he thinks it should be. Client making threats to call 911. "I need something for pain more than what y"all are giving me" "I going to have to go to the emergency room" Client proceeded to tell me he takes Percocet etc. At home. "this ain't nothing y'all giving me, for pain" A: Clinical research associateWriter provided emotional support, staffed with Barbara CowerJason, NP new order given for increase in Robaxin to 1000 mg(see MAR). Medication reviewed, administered as ordered. Staff will monitor q6915min for safety. R: Client is safe on the unit, attended group, but stormed out.

## 2016-08-26 NOTE — Progress Notes (Addendum)
Patient ID: Jonny RuizJames O Hardacre, male   DOB: 1976/04/20, 40 y.o.   MRN: [id number] D: Client up at nursing station, requesting pain medications "I need something or I'm going to the emergency room" Client loud cursing writer and MHT's. "y'all ain't shit and you ain't doing shit for me" "I want to get out of this  MFing place" Client alluding to getting medications he claims he was taking at home "Percocet" and related's.  Client loud and obnoxious.Threatening staff "I'll kick your ass"  A: Writer attempted to inform client that medications had been given and are not scheduled to be given again for another couple of hours. Writer also noted that NP had been notified of pain and medications were adjusted and administered at that time. "I don't give a dam" "I need some more medicine" Client irrational and manipulative refused to listen to reason and began to escalate. Barbara CowerJason, NP was notified, Noralee StainJoAnn, G. AC,  as well as other staff members for support. Received new orders for Ativan and Toradol (see MAR). R: Medications administered by Eula FriedMichelle S. RN. Client went bed.

## 2016-08-26 NOTE — Progress Notes (Signed)
Adult Psychoeducational Group Note  Date:  08/26/2016 Time:  9:34 PM  Group Topic/Focus:  Wrap-Up Group:   The focus of this group is to help patients review their daily goal of treatment and discuss progress on daily workbooks.   Participation Level:  Active  Participation Quality:  Appropriate and Attentive  Affect:  Appropriate  Cognitive:  Appropriate  Insight: Appropriate  Engagement in Group:  Engaged  Modes of Intervention:  Discussion  Additional Comments:  Pt stated he had a good day. Pt talked of trying to uplift some of his peers throughout the day. Pt stated his goal was to figure out how to get out of here. Pt stated he talked to the doctors and maybe up for discharge Monday or Tuesday.  Caswell CorwinOwen, Milton Streicher C 08/26/2016, 9:34 PM

## 2016-08-26 NOTE — Progress Notes (Signed)
[facility]  08/26/2016 12:43 PM Mitchell George  MRN:  [id number]  Subjective:  Patient reports " I am feeling fine, I feel ready to discharge." - Per staffing notes patient is irritable and agitated due to chronic low back pain  Objective: Mitchell RuizJames O Motton is awake, alert and oriented *3 , Seen resting in his bedroom. Denies suicidal or homicidal ideation. Patient was asked in particular homicidal ideation toward Fleet ContrasRachel, patient denies thoughts or intent.Patient reports he was able to talk to his girlfriend on yesterday and states he has a productive conversation. Patient denies depression or depressive symptoms. Denies auditory or visual hallucination and does not appear to be responding to internal stimuli. Patient reports he is medication compliant without mediation side effects. Reports good appetite other wise and resting well. Support, encouragement and reassurance was provided.   Principal Problem: Bipolar 1 disorder, depressed, severe Diagnosis:   Patient Active Problem List   Diagnosis Date Noted  . Bipolar disorder, current episode mixed, moderate (HCC) [F31.62]   . Bipolar 1 disorder, depressed, severe (HCC) [F31.4] 08/17/2016  . PTSD (post-traumatic stress disorder) [F43.10] 08/17/2016  . Affective psychosis, bipolar (HCC) [F31.9] 08/17/2016   Total Time spent with patient: 40  minutes   Past Psychiatric History: PTSD, Bipolar 1 disorder.  Past Medical History:  Past Medical History:  Diagnosis Date  . Bipolar 1 disorder (HCC)   . Diabetes mellitus   . PTSD (post-traumatic stress disorder)   . Schizophrenia [facility](HCC)     Past Surgical History:  Procedure Laterality Date  . HAND SURGERY    . KNEE SURGERY     Family History: History reviewed. No pertinent family history. Family Psychiatric  History: See H&P  Social History:  History  Alcohol Use  . Yes     History  Drug Use  . Types: Marijuana    Social History   Social History  . Marital status:  Single    Spouse name: N/A  . Number of children: N/A  . Years of education: N/A   Social History Main Topics  . Smoking status: Current Every Day Smoker    Packs/day: 1.00  . Smokeless tobacco: Never Used  . Alcohol use Yes  . Drug use:     Types: Marijuana  . Sexual activity: Not Asked   Other Topics Concern  . None   Social History Narrative  . None   Additional Social History:    Sleep:  Fair   Appetite:  Fair  Current Medications: Current Facility-Administered Medications  Medication Dose Route Frequency Provider Last Rate Last Dose  . alum & mag hydroxide-simeth (MAALOX/MYLANTA) 200-200-20 MG/5ML suspension 30 mL  30 mL Oral PRN Charm RingsJamison Y Lord, NP   30 mL at 08/22/16 1118  . atorvastatin (LIPITOR) tablet 40 mg  40 mg Oral q1800 Sanjuana KavaAgnes I Nwoko, NP   40 mg at 08/25/16 1707  . diphenhydrAMINE (BENADRYL) injection 50 mg  50 mg Intramuscular Once Craige CottaFernando A Cobos, MD      . divalproex (DEPAKOTE ER) 24 hr tablet 1,000 mg  1,000 mg Oral QHS Craige CottaFernando A Cobos, MD   1,000 mg at 08/25/16 2104  . gabapentin (NEURONTIN) capsule 600 mg  600 mg Oral QID Sanjuana KavaAgnes I Nwoko, NP   600 mg at 08/26/16 1156  . hydrOXYzine (ATARAX/VISTARIL) tablet 50 mg  50 mg Oral Q6H PRN Sanjuana KavaAgnes I Nwoko, NP   50 mg at 08/25/16 1828  . ibuprofen (ADVIL,MOTRIN) tablet 800 mg  800 mg Oral Q6H  PRN Jackelyn Poling, NP   800 mg at 08/26/16 [phone]  . Influenza vac split quadrivalent PF (FLUARIX) injection 0.5 mL  0.5 mL Intramuscular Tomorrow-1000 Fernando A Cobos, MD      . lidocaine (LIDODERM) 5 % 1 patch  1 patch Transdermal Q24H Craige Cotta, MD   1 patch at 08/26/16 0752  . LORazepam (ATIVAN) tablet 1 mg  1 mg Oral Q8H PRN Craige Cotta, MD   1 mg at 08/25/16 2207   Or  . LORazepam (ATIVAN) injection 1 mg  1 mg Intramuscular Q8H PRN Rockey Situ Cobos, MD      . magnesium hydroxide (MILK OF MAGNESIA) suspension 30 mL  30 mL Oral Daily PRN Charm Rings, NP      . metFORMIN (GLUCOPHAGE) tablet 500 mg  500 mg Oral  BID WC Charm Rings, NP   500 mg at 08/26/16 0800  . methocarbamol (ROBAXIN) tablet 1,000 mg  1,000 mg Oral Q8H PRN Jackelyn Poling, NP   1,000 mg at 08/26/16 0833  . nicotine polacrilex (NICORETTE) gum 2 mg  2 mg Oral PRN Craige Cotta, MD   2 mg at 08/26/16 1028  . ondansetron (ZOFRAN) tablet 4 mg  4 mg Oral Q8H PRN Charm Rings, NP      . pantoprazole (PROTONIX) EC tablet 20 mg  20 mg Oral BID Craige Cotta, MD   20 mg at 08/26/16 0751  . propranolol (INDERAL) tablet 10 mg  10 mg Oral TID Sanjuana Kava, NP   10 mg at 08/26/16 1155  . QUEtiapine (SEROQUEL) tablet 300 mg  300 mg Oral QHS Craige Cotta, MD   300 mg at 08/25/16 2105  . QUEtiapine (SEROQUEL) tablet 50 mg  50 mg Oral BID Craige Cotta, MD   50 mg at 08/26/16 0751  . traZODone (DESYREL) tablet 50 mg  50 mg Oral QHS PRN Craige Cotta, MD   50 mg at 08/25/16 2105  . ziprasidone (GEODON) injection 20 mg  20 mg Intramuscular Q12H PRN Craige Cotta, MD       Lab Results:  Results for orders placed or performed during the hospital encounter of 08/17/16 (from the past 48 hour(s))  Glucose, capillary     Status: Abnormal   Collection Time: 08/25/16  6:15 AM  Result Value Ref Range   Glucose-Capillary 154 (H) 65 - 99 mg/dL  Glucose, capillary     Status: Abnormal   Collection Time: 08/26/16  6:30 AM  Result Value Ref Range   Glucose-Capillary 184 (H) 65 - 99 mg/dL  Glucose, capillary     Status: Abnormal   Collection Time: 08/26/16 11:39 AM  Result Value Ref Range   Glucose-Capillary 236 (H) 65 - 99 mg/dL   Comment 1 Notify RN    Comment 2 Document in Chart     Blood Alcohol level:  Lab Results  Component Value Date   ETH <5 08/16/2016   Metabolic Disorder Labs: Lab Results  Component Value Date   HGBA1C 10.7 (H) 08/20/2016   MPG 260 08/20/2016   MPG 275 08/19/2016   Lab Results  Component Value Date   PROLACTIN 10.7 08/20/2016   Lab Results  Component Value Date   CHOL 218 (H) 08/19/2016   TRIG  192 (H) 08/19/2016   HDL 37 (L) 08/19/2016   CHOLHDL 5.9 08/19/2016   VLDL 38 08/19/2016   LDLCALC 143 (H) 08/19/2016   Physical Findings: AIMS: Facial  and Oral Movements Muscles of Facial Expression: None, normal Lips and Perioral Area: None, normal Jaw: None, normal Tongue: None, normal,Extremity Movements Upper (arms, wrists, hands, fingers): None, normal Lower (legs, knees, ankles, toes): None, normal, Trunk Movements Neck, shoulders, hips: None, normal, Overall Severity Severity of abnormal movements (highest score from questions above): None, normal Incapacitation due to abnormal movements: None, normal Patient's awareness of abnormal movements (rate only patient's report): No Awareness, Dental Status Current problems with teeth and/or dentures?: No Does patient usually wear dentures?: No  CIWA:    COWS:     Musculoskeletal: Strength & Muscle Tone: within normal limits Gait & Station: normal Patient leans: N/A  Psychiatric Specialty Exam: Physical Exam  Nursing note and vitals reviewed. Constitutional: He is oriented to person, place, and time. He appears well-developed.  Cardiovascular: Normal rate and regular rhythm.   Musculoskeletal: Normal range of motion.  Neurological: He is alert and oriented to person, place, and time.  Psychiatric: He has a normal mood and affect. His behavior is normal.  : See H&P  Review of Systems  Psychiatric/Behavioral: Positive for depression (increased tearfulness) and substance abuse (Hx, Cannabis use disorder). Negative for hallucinations, memory loss and suicidal ideas. The patient is nervous/anxious and has insomnia.   reports chronic pain, affecting legs and back  Blood pressure 119/86, pulse 76, temperature 97.6 F (36.4 C), resp. rate 15, height 6\' 1"  (1.854 m), weight 104.3 kg (230 lb), SpO2 96 %.Body mass index is 30.34 kg/m.  General Appearance:  fairly groomed   Eye Contact:  Good  Speech:  Normal   Volume: WNL  Mood:  pleasant and clame  Affect: labile, gaureded  Thought Process:  Linear   Orientation:  Full (Time, Place, and Person)  Thought Content:  linear , no flight of ideations noted   Suicidal Thoughts: making suicidal statements such as reporting he wanted police to shoot him, and reporting he planned to stop eating so he would starve to death (deneis during this assessment)  Homicidal Thoughts:(+) threatening statements as above - patient denies homicidal ideation toward girlfriend today.   Memory:  recent and remote grossly intact   Judgement:  limited   Insight:  ,limited   Psychomotor Activity: Restlessness- improving   Concentration:  Concentration: good  and Attention Span: good   Recall: good   Fund of Knowledge:  good  Language:  good  Akathisia:  No  Handed:  Right  AIMS (if indicated):     Assets:  Communication Skills Desire for Improvement  ADL's:  Intact  Cognition:  WNL  Sleep:  Number of Hours: 5.5       I agree with current treatment plan on 08/26/2016, Patient seen face-to-face for psychiatric evaluation follow-up, chart reviewed.Reviewed the information documented and agree with the treatment plan.  Treatment Plan Summary: Daily contact with patient to assess and evaluate symptoms and progress in treatment and Medication management: Internal medicine consult orders placed for elevated CBG's Bipolar disorder: Will contiune Seroquel  to 50  mgrs BID and 300  mgrs QHS  Mood instability: Will continue Depakote ER to 1000  mgrs  QHS   Agitation/pain: Will continue Gabapentin to 600 mg QID. Will continue Lidoderm patch to help address chronic back pain Anxiety:  Continue Ativan 0.5 mgrs Q 6 hours PRN for anxiety as needed  .  Continue  Propranolol 10 mg TID  Insomnia: Continue Trazodone 50  mgrs QHS PRN for insomnia . Treatment team working on disposition planning options  *  Patient's status has been changed to involuntary/IVCd    Oneta Rack, NP 08/26/2016, 12:43 PM    Agree with NP Progress Note as Above

## 2016-08-26 NOTE — Progress Notes (Signed)
Pt got angered by another pt and had an exchange of words, pt was redirected and went to his room. Will continue to monitor.

## 2016-08-27 LAB — GLUCOSE, CAPILLARY
Glucose-Capillary: 195 mg/dL — ABNORMAL HIGH (ref 65–99)
Glucose-Capillary: 264 mg/dL — ABNORMAL HIGH (ref 65–99)

## 2016-08-27 LAB — VALPROIC ACID LEVEL: Valproic Acid Lvl: 43 ug/mL — ABNORMAL LOW (ref 50.0–100.0)

## 2016-08-27 MED ORDER — NICOTINE POLACRILEX 2 MG MT GUM: 2.0000 mg | CHEWING_GUM | OROMUCOSAL | 0 refills | Status: DC | PRN

## 2016-08-27 MED ORDER — METFORMIN HCL 500 MG PO TABS: 500.0000 mg | ORAL_TABLET | Freq: Two times a day (BID) | ORAL | 0 refills | Status: AC

## 2016-08-27 MED ORDER — PANTOPRAZOLE SODIUM 20 MG PO TBEC: 20.0000 mg | DELAYED_RELEASE_TABLET | Freq: Two times a day (BID) | ORAL | 0 refills | Status: AC

## 2016-08-27 MED ORDER — ATORVASTATIN CALCIUM 40 MG PO TABS: 40.0000 mg | ORAL_TABLET | Freq: Every day | ORAL | 0 refills | Status: AC

## 2016-08-27 MED ORDER — QUETIAPINE FUMARATE 50 MG PO TABS: 50.0000 mg | ORAL_TABLET | Freq: Two times a day (BID) | ORAL | 0 refills | Status: DC

## 2016-08-27 MED ORDER — QUETIAPINE FUMARATE 300 MG PO TABS: 300.0000 mg | ORAL_TABLET | Freq: Every day | ORAL | 0 refills | Status: DC

## 2016-08-27 MED ORDER — HYDROXYZINE HCL 50 MG PO TABS: 50.0000 mg | ORAL_TABLET | Freq: Four times a day (QID) | ORAL | 0 refills | Status: DC | PRN

## 2016-08-27 MED ORDER — INSULIN ASPART 100 UNIT/ML ~~LOC~~ SOLN: 0.0000 [IU] | Freq: Every day | SUBCUTANEOUS | Status: DC

## 2016-08-27 MED ORDER — GABAPENTIN 300 MG PO CAPS: 600.0000 mg | ORAL_CAPSULE | Freq: Four times a day (QID) | ORAL | 0 refills | Status: AC

## 2016-08-27 MED ORDER — GABAPENTIN 600 MG PO TABS
600.0000 mg | ORAL_TABLET | Freq: Four times a day (QID) | ORAL | Status: DC
  Filled 2016-08-27 (×4): qty 28

## 2016-08-27 MED ORDER — PROPRANOLOL HCL 10 MG PO TABS: 10.0000 mg | ORAL_TABLET | Freq: Three times a day (TID) | ORAL | 0 refills | Status: DC

## 2016-08-27 MED ORDER — INSULIN ASPART 100 UNIT/ML ~~LOC~~ SOLN: 0.0000 [IU] | Freq: Three times a day (TID) | SUBCUTANEOUS | Status: DC

## 2016-08-27 MED ORDER — TRAZODONE HCL 50 MG PO TABS: 50.0000 mg | ORAL_TABLET | Freq: Every evening | ORAL | 0 refills | Status: AC | PRN

## 2016-08-27 MED ORDER — DIVALPROEX SODIUM ER 500 MG PO TB24: 1000.0000 mg | ORAL_TABLET | Freq: Every day | ORAL | 0 refills | Status: DC

## 2016-08-27 NOTE — Progress Notes (Signed)
D: Pt presents animated at present. Denies SI, HI, AVH and pain. Visible in milieu at intervals during shift for unit groups. Pt d/c home as per order and was picked up in the front of the building by his "male friend". A: Scheduled and PRN medications administered as prescribed. Stat VPA level done as ordered prior to d/c.  D/C instructions reviewed with pt including medications, prescriptions and follow up appointment.   All belongings in locker 38 returned to pt at time of d/c.  Q 15 minutes safety checks maintained without gestures of self harm to note thus far. R: Pt receptive to care. Compliant with medications when offered. Denies adverse drug reactions when assessed. Verbalized understanding related to d/c instructions. Signed belonging sheet in agreement with items received. No physical distress evident at time of departure from facility.

## 2016-08-27 NOTE — Progress Notes (Signed)
Patient has slept very well since being medicated for agitation last night. No complications noted. WIll continue to monitor Q 15 minutes for patient safety and medication effectiveness.

## 2016-08-27 NOTE — Progress Notes (Signed)
Recreation Therapy Notes  Date: 08/27/16 Time: 1000 Location: 500 Dayroom  Group Topic: Wellness  Goal Area(s) Addresses:  Patient will define components of whole wellness. Patient will verbalize benefit of whole wellness.  Behavioral Response: Engaged  Intervention: Chairs  Activity: Chair exercises.  LRT led patients through seated low impact exercises that worked their range of motion, allowed them to stretch and make use of their deep breathing.    Education:Wellness, Discharge Planning.   Education Outcome: Acknowledges education/In group clarification offered/Needs additional education.   Clinical Observations/Feedback: Pt was active and fully participated.  Pt left early and did not return.    Caroll RancherMarjette Tripton Ned, LRT/CTRS     Caroll RancherLindsay, Aarushi Hemric A 08/27/2016 12:33 PM

## 2016-08-27 NOTE — [facility] Suicide Risk Assessment (Addendum)
[facility]   Principal Problem: Bipolar disorder, current episode mixed, moderate (HCC) Discharge Diagnoses:  Patient Active Problem List   Diagnosis Date Noted  . Bipolar disorder, current episode mixed, moderate (HCC) [F31.62]   . Bipolar 1 disorder, depressed, severe (HCC) [F31.4] 08/17/2016  . PTSD (post-traumatic stress disorder) [F43.10] 08/17/2016    Total Time spent with patient: 30 minutes  Musculoskeletal: Strength & Muscle Tone: within normal limits Gait & Station: normal Patient leans: N/A  Psychiatric Specialty Exam: Review of Systems  Psychiatric/Behavioral: Negative for depression and hallucinations. The patient is not nervous/anxious.   All other systems reviewed and are negative.   Blood pressure 111/89, pulse 70, temperature 97.6 F (36.4 C), resp. rate 15, height 6\' 1"  (1.854 m), weight 104.3 kg (230 lb), SpO2 96 %.Body mass index is 30.34 kg/m.  General Appearance: Casual  Eye Contact::  Fair  Speech:  Clear and Coherent409  Volume:  Normal  Mood:  Euthymic  Affect:  Appropriate  Thought Process:  Goal Directed and Descriptions of Associations: Intact  Orientation:  Full (Time, Place, and Person)  Thought Content:  Logical  Suicidal Thoughts:  No  Homicidal Thoughts:  No  Memory:  Immediate;   Fair Recent;   Fair Remote;   Fair  Judgement:  Fair  Insight:  Fair  Psychomotor Activity:  Normal  Concentration:  Fair  Recall:  [employer]Fair  Fund of Knowledge:Fair  Language: Fair  Akathisia:  No  Handed:  Right  AIMS (if indicated):     Assets:  Communication Skills Desire for Improvement Physical Health Social Support  Sleep:  Number of Hours: 5.25  Cognition: WNL  ADL's:  Intact   Mental Status Per Nursing Assessment::   On Admission:     Demographic Factors:  Male and Caucasian  Loss Factors: NA  Historical Factors: Impulsivity  Risk Reduction Factors:   Positive social support and Positive coping skills or  problem solving skills  Continued Clinical Symptoms:  Previous Psychiatric Diagnoses and Treatments  Cognitive Features That Contribute To Risk:  None    Suicide Risk:  Minimal: No identifiable suicidal ideation.  Patients presenting with no risk factors but with morbid ruminations; may be classified as minimal risk based on the severity of the depressive symptoms  Violence risk : Pt has mental illness as well as co- existing substance abuse ,has a hx of aggression , and legal issues when he was a teenager ,  However he is compliant on his medications , his  insight and judgement has improved , is less  Impulsive than on admission , is able to cope with his anger issues and is very motivated to get help and make follow up appointments as well as anger management classes with his out patient provider , is willing to ask for help if he feels his symptoms are worsening.   Patient hence is at minimal acute risk for violence and moderate chronic risk for violence.    Follow-up Information    [facility]   Why:  Hospital discharge follow up appointment Contact information: [address] [place] [address] [phone]           Plan Of Care/Follow-up recommendations:  Activity:  no restrictions Diet:  carb modified Tests:  Depakote level on 08/28/16 Other:  none  Mylon Mabey, MD 08/27/2016, 9:30 AM

## 2016-08-27 NOTE — Tx Team (Signed)
Interdisciplinary Treatment and Diagnostic Plan Update  08/27/2016 Time of Session: 10:17 AM  Mitchell George MRN: [id number]  Principal Diagnosis: Bipolar 1 Disorder  Secondary Diagnoses: Principal Problem:   Bipolar disorder, current episode mixed, moderate (HCC)   Current Medications:  Current Facility-Administered Medications  Medication Dose Route Frequency Provider Last Rate Last Dose  . alum & mag hydroxide-simeth (MAALOX/MYLANTA) 200-200-20 MG/5ML suspension 30 mL  30 mL Oral PRN Charm Rings, NP   30 mL at 08/22/16 1118  . atorvastatin (LIPITOR) tablet 40 mg  40 mg Oral q1800 Sanjuana Kava, NP   40 mg at 08/26/16 1700  . diphenhydrAMINE (BENADRYL) injection 50 mg  50 mg Intramuscular Once Craige Cotta, MD      . divalproex (DEPAKOTE ER) 24 hr tablet 1,000 mg  1,000 mg Oral QHS Rockey Situ Cobos, MD   1,000 mg at 08/26/16 2130  . gabapentin (NEURONTIN) capsule 600 mg  600 mg Oral QID Sanjuana Kava, NP   600 mg at 08/27/16 0818  . hydrOXYzine (ATARAX/VISTARIL) tablet 50 mg  50 mg Oral Q6H PRN Sanjuana Kava, NP   50 mg at 08/26/16 2035  . ibuprofen (ADVIL,MOTRIN) tablet 800 mg  800 mg Oral Q6H PRN Jackelyn Poling, NP   800 mg at 08/27/16 0821  . Influenza vac split quadrivalent PF (FLUARIX) injection 0.5 mL  0.5 mL Intramuscular Tomorrow-1000 Fernando A Cobos, MD      . insulin aspart (novoLOG) injection 0-15 Units  0-15 Units Subcutaneous TID WC Saramma Eappen, MD      . insulin aspart (novoLOG) injection 0-5 Units  0-5 Units Subcutaneous QHS Saramma Eappen, MD      . lidocaine (LIDODERM) 5 % 1 patch  1 patch Transdermal Q24H Craige Cotta, MD   1 patch at 08/27/16 0818  . LORazepam (ATIVAN) 1 MG tablet           . LORazepam (ATIVAN) tablet 1 mg  1 mg Oral Q8H PRN Craige Cotta, MD   1 mg at 08/26/16 2129   Or  . LORazepam (ATIVAN) injection 1 mg  1 mg Intramuscular Q8H PRN Rockey Situ Cobos, MD      . magnesium hydroxide (MILK OF MAGNESIA) suspension 30 mL  30 mL Oral  Daily PRN Charm Rings, NP      . metFORMIN (GLUCOPHAGE) tablet 500 mg  500 mg Oral BID WC Charm Rings, NP   500 mg at 08/27/16 0818  . methocarbamol (ROBAXIN) tablet 1,000 mg  1,000 mg Oral Q8H PRN Jackelyn Poling, NP   1,000 mg at 08/27/16 0821  . nicotine polacrilex (NICORETTE) gum 2 mg  2 mg Oral PRN Craige Cotta, MD   2 mg at 08/26/16 2242  . ondansetron (ZOFRAN) tablet 4 mg  4 mg Oral Q8H PRN Charm Rings, NP      . pantoprazole (PROTONIX) EC tablet 20 mg  20 mg Oral BID Craige Cotta, MD   20 mg at 08/27/16 0818  . propranolol (INDERAL) tablet 10 mg  10 mg Oral TID Sanjuana Kava, NP   10 mg at 08/27/16 0819  . QUEtiapine (SEROQUEL) tablet 300 mg  300 mg Oral QHS Craige Cotta, MD   300 mg at 08/26/16 2129  . QUEtiapine (SEROQUEL) tablet 50 mg  50 mg Oral BID Craige Cotta, MD   50 mg at 08/27/16 0818  . traZODone (DESYREL) tablet 50 mg  50 mg Oral  QHS PRN Craige Cotta, MD   50 mg at 08/25/16 2105  . ziprasidone (GEODON) injection 20 mg  20 mg Intramuscular Q12H PRN Craige Cotta, MD       PTA Medications: Prescriptions Prior to Admission  Medication Sig Dispense Refill Last Dose  . diclofenac (VOLTAREN) 75 MG EC tablet TAKE 1 TABLET BY MOUTH TWICE A DAY (Patient not taking: Reported on 08/16/2016) 120 tablet 2 Not Taking at Unknown time  . diclofenac (VOLTAREN) 75 MG EC tablet TAKE 1 TABLET BY MOUTH TWICE A DAY (Patient not taking: Reported on 08/16/2016) 60 tablet 2 Not Taking at Unknown time  . ibuprofen (ADVIL,MOTRIN) 600 MG tablet Take 1 tablet (600 mg total) by mouth every 6 (six) hours as needed. (Patient not taking: Reported on 08/16/2016) 30 tablet 0 Not Taking at Unknown time  . methocarbamol (ROBAXIN) 500 MG tablet Take 2 tablets (1,000 mg total) by mouth 4 (four) times daily as needed (Pain). (Patient not taking: Reported on 08/16/2016) 20 tablet 0 Not Taking at Unknown time  . sulfamethoxazole-trimethoprim (SEPTRA DS) 800-160 MG per tablet Take 1 tablet by  mouth every 12 (twelve) hours. (Patient not taking: Reported on 08/16/2016) 14 tablet 0 Not Taking at Unknown time  . traZODone (DESYREL) 150 MG tablet Take 150-300 mg by mouth at bedtime.   Past Week at Unknown time    Patient Stressors: Financial difficulties Health problems Medication change or noncompliance Substance abuse  Patient Strengths: Average or above average intelligence Communication skills Supportive family/friends  Treatment Modalities: Medication Management, Group therapy, Case management,  1 to 1 session with clinician, Psychoeducation, Recreational therapy.   Physician Treatment Plan for Primary Diagnosis: Bipolar disorder, current episode mixed, moderate (HCC) Long Term Goal(s): Improvement in symptoms so as ready for discharge Improvement in symptoms so as ready for discharge   Short Term Goals: Ability to verbalize feelings will improve Ability to disclose and discuss suicidal ideas Ability to identify and develop effective coping behaviors will improve Ability to identify triggers associated with substance abuse/mental health issues will improve Ability to identify changes in lifestyle to reduce recurrence of condition will improve Ability to verbalize feelings will improve Compliance with prescribed medications will improve  Medication Management: Evaluate patient's response, side effects, and tolerance of medication regimen.  Therapeutic Interventions: 1 to 1 sessions, Unit Group sessions and Medication administration.  Evaluation of Outcomes: Adequate for Discharge  Physician Treatment Plan for Secondary Diagnosis: Principal Problem:   Bipolar disorder, current episode mixed, moderate (HCC)  Long Term Goal(s): Improvement in symptoms so as ready for discharge Improvement in symptoms so as ready for discharge   Short Term Goals: Ability to verbalize feelings will improve Ability to disclose and discuss suicidal ideas Ability to identify and develop  effective coping behaviors will improve Ability to identify triggers associated with substance abuse/mental health issues will improve Ability to identify changes in lifestyle to reduce recurrence of condition will improve Ability to verbalize feelings will improve Compliance with prescribed medications will improve     Medication Management: Evaluate patient's response, side effects, and tolerance of medication regimen.  Therapeutic Interventions: 1 to 1 sessions, Unit Group sessions and Medication administration.  Evaluation of Outcomes: Adequate for discharge   RN Treatment Plan for Primary Diagnosis: Bipolar 1 Disorder Long Term Goal(s): Knowledge of disease and therapeutic regimen to maintain health will improve  Short Term Goals: Ability to verbalize frustration and anger appropriately will improve and Ability to demonstrate self-control  Medication Management: RN will  administer medications as ordered by provider, will assess and evaluate patient's response and provide education to patient for prescribed medication. RN will report any adverse and/or side effects to prescribing provider.  Therapeutic Interventions: 1 on 1 counseling sessions, Psychoeducation, Medication administration, Evaluate responses to treatment, Monitor vital signs and CBGs as ordered, Perform/monitor CIWA, COWS, AIMS and Fall Risk screenings as ordered, Perform wound care treatments as ordered.  Evaluation of Outcomes: Adequate for Discharge   LCSW Treatment Plan for Primary Diagnosis: Bipolar 1 Disorder  Long Term Goal(s): Safe transition to appropriate next level of care at discharge, Engage patient in therapeutic group addressing interpersonal concerns.  Short Term Goals: Engage patient in aftercare planning with referrals and resources, Increase emotional regulation and Identify triggers associated with mental health/substance abuse issues  Therapeutic Interventions: Assess for all discharge needs, 1  to 1 time with Social worker, Explore available resources and support systems, Assess for adequacy in community support network, Educate family and significant other(s) on suicide prevention, Complete Psychosocial Assessment, Interpersonal group therapy.  Evaluation of Outcomes: Adequate for Discharge   Progress in Treatment: Attending groups: Yes. Participating in groups: Yes. Taking medication as prescribed: Yes. Toleration medication: Yes. Family/Significant other contact made: Yes, individual(s) contacted:  with friend, Mitchell George Patient understands diagnosis: Yes. Discussing patient identified problems/goals with staff: Yes. Medical problems stabilized or resolved: Yes. Denies suicidal/homicidal ideation: Yes Issues/concerns per patient self-inventory: No. Other: NA  New problem(s) identified: Yes, Describe:  anger management, medication stabilization  New Short Term/Long Term Goal(s):  Discharge Plan or Barriers: Return home, [facility]  Reason for Continuation of Hospitalization:   Estimated Length of Stay: D/C today  Attendees: Patient: 08/27/2016 10:17 AM  Physician: Jomarie LongsSaramma Eappen 08/27/2016 10:17 AM  Nursing: Lissa Hoardlivette W RN 08/27/2016 10:17 AM  RN Care Manager: Sondra BargesJ Clark RN CM 08/27/2016 10:17 AM  Social Worker: Richelle Itood Michalla Ringer, LCSW 08/27/2016 10:17 AM  Recreational Therapist:  08/27/2016 10:17 AM  Other:  08/27/2016 10:17 AM  Other:  08/27/2016 10:17 AM  Other: 08/27/2016 10:17 AM    Scribe for Treatment Team: Ida Rogueodney B Man Bonneau, LCSW 08/27/2016 10:17 AM
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
## 2016-08-27 NOTE — Progress Notes (Signed)
Inpatient Diabetes Program Recommendations  AACE/ADA: New Consensus Statement on Inpatient Glycemic Control (2015)  Target Ranges:  Prepandial:   less than 140 mg/dL      Peak postprandial:   less than 180 mg/dL (1-2 hours)      Critically ill patients:  140 - 180 mg/dL   Lab Results  Component Value Date   GLUCAP 195 (H) 08/27/2016   HGBA1C 10.7 (H) 08/20/2016    Review of Glycemic Control  No correction insulin ordered. Blood sugars variable.  Inpatient Diabetes Program Recommendations:   Novolog moderate tidwc and hs Needs referral for PCP to manage his diabetes after discharge.  Thank you. Ailene Ardshonda Damier Disano, RD, LDN, CDE Inpatient Diabetes Coordinator [phone]

## 2016-08-27 NOTE — Discharge Summary (Signed)
Physician Discharge Summary Note  Patient:  Mitchell George is an 40 y.o., male MRN:  [id number] DOB:  1976-02-18 Patient phone:  [phone] (home)  Patient address:   [address] [place] [address],  Total Time spent with patient: 30 minutes  Date of Admission:  08/17/2016 Date of Discharge: 08/27/2016  Reason for Admission:  Worsening depression  Principal Problem: Bipolar disorder, current episode mixed, moderate (HCC) Discharge Diagnoses: Patient Active Problem List   Diagnosis Date Noted  . Bipolar disorder, current episode mixed, moderate (HCC) [F31.62]   . Bipolar 1 disorder, depressed, severe (HCC) [F31.4] 08/17/2016  . PTSD (post-traumatic stress disorder) [F43.10] 08/17/2016    Past Psychiatric History: see HPI  Past Medical History:  Past Medical History:  Diagnosis Date  . Bipolar 1 disorder (HCC)   . Diabetes mellitus   . PTSD (post-traumatic stress disorder)   . Schizophrenia [facility])     Past Surgical History:  Procedure Laterality Date  . HAND SURGERY    . KNEE SURGERY     Family History: History reviewed. No pertinent family history. Family Psychiatric  History: see HPI Social History:  History  Alcohol Use  . Yes     History  Drug Use  . Types: Marijuana    Social History   Social History  . Marital status: Single    Spouse name: N/A  . Number of children: N/A  . Years of education: N/A   Social History Main Topics  . Smoking status: Current Every Day Smoker    Packs/day: 1.00  . Smokeless tobacco: Never Used  . Alcohol use Yes  . Drug use:     Types: Marijuana  . Sexual activity: Not Asked   Other Topics Concern  . None   Social History Narrative  . None    Hospital Course:  Mitchell George, 40 year old Caucasian male with hx of mental illness & multiple suicidal attempts. Admitted to the [facility] adult unit from the [facility] ED after he complained of feeling like everything is closing in on him and afraid he was going to  attempt suicide again.  Mitchell George was admitted for Bipolar disorder, current episode mixed, moderate (HCC) and crisis management.  Patient was treated with medications with their indications listed below in detail under Medication List.  Medical problems were identified and treated as needed.  Home medications were restarted as appropriate.  Improvement was monitored by observation and Mitchell George daily report of symptom reduction.  Emotional and mental status was monitored by daily self inventory reports completed by Mitchell George and clinical staff.  Patient reported continued improvement, denied any new concerns.  Patient had been compliant on medications and denied side effects.  Support and encouragement was provided.    Patient encouraged to attend groups to help with recognizing triggers of emotional crises and de-stabilizations.  Patient encouraged to attend group to help identify the positive things in life that would help in dealing with feelings of loss, depression and unhealthy or abusive tendencies.         Mitchell George was evaluated by the treatment team for stability and plans for continued recovery upon discharge.  Patient was offered further treatment options upon discharge including Residential, Intensive Outpatient and Outpatient treatment. Patient will follow up with agency listed below for medication management and counseling.  Encouraged patient to maintain satisfactory support network and home environment.  Advised to adhere to medication compliance and outpatient treatment follow up.  Prescriptions provided.       Mitchell George motivation was an integral factor for scheduling further treatment.  Employment, transportation, bed availability, health status, family support, and any pending legal issues were also considered during patient's hospital stay.  Upon completion of this admission the patient was both mentally and medically stable for discharge denying  suicidal/homicidal ideation, auditory/visual/tactile hallucinations, delusional thoughts and paranoia.      Physical Findings: AIMS: Facial and Oral Movements Muscles of Facial Expression: None, normal Lips and Perioral Area: None, normal Jaw: None, normal Tongue: None, normal,Extremity Movements Upper (arms, wrists, hands, fingers): None, normal Lower (legs, knees, ankles, toes): None, normal, Trunk Movements Neck, shoulders, hips: None, normal, Overall Severity Severity of abnormal movements (highest score from questions above): None, normal Incapacitation due to abnormal movements: None, normal Patient's awareness of abnormal movements (rate only patient's report): No Awareness, Dental Status Current problems with teeth and/or dentures?: No Does patient usually wear dentures?: No  CIWA:    COWS:     Musculoskeletal: Strength & Muscle Tone: within normal limits Gait & Station: normal Patient leans: N/A  Psychiatric Specialty Exam:  See MD SRA Physical Exam  ROS  Blood pressure 111/89, pulse 70, temperature 97.6 F (36.4 C), resp. rate 15, height 6\' 1"  (1.854 m), weight 104.3 kg (230 lb), SpO2 96 %.Body mass index is 30.34 kg/m.   Have you used any form of tobacco in the last 30 days? (Cigarettes, Smokeless Tobacco, Cigars, and/or Pipes): Yes  Has this patient used any form of tobacco in the last 30 days? (Cigarettes, Smokeless Tobacco, Cigars, and/or Pipes) Yes, RX given to patient  Blood Alcohol level:  Lab Results  Component Value Date   ETH <5 08/16/2016    Metabolic Disorder Labs:  Lab Results  Component Value Date   HGBA1C 10.7 (H) 08/20/2016   MPG 260 08/20/2016   MPG 275 08/19/2016   Lab Results  Component Value Date   PROLACTIN 10.7 08/20/2016   Lab Results  Component Value Date   CHOL 218 (H) 08/19/2016   TRIG 192 (H) 08/19/2016   HDL 37 (L) 08/19/2016   CHOLHDL 5.9 08/19/2016   VLDL 38 08/19/2016   LDLCALC 143 (H) 08/19/2016    See Psychiatric  Specialty Exam and Suicide Risk Assessment completed by Attending Physician prior to discharge.  Discharge destination:  Home  Is patient on multiple antipsychotic therapies at discharge:  No   Has Patient had three or more failed trials of antipsychotic monotherapy by history:  No  Recommended Plan for Multiple Antipsychotic Therapies: NA     Medication List    STOP taking these medications   diclofenac 75 MG EC tablet Commonly known as:  VOLTAREN   ibuprofen 600 MG tablet Commonly known as:  ADVIL,MOTRIN   methocarbamol 500 MG tablet Commonly known as:  ROBAXIN   sulfamethoxazole-trimethoprim 800-160 MG tablet Commonly known as:  SEPTRA DS     TAKE these medications     Indication  atorvastatin 40 MG tablet Commonly known as:  LIPITOR Take 1 tablet (40 mg total) by mouth daily at 6 PM.  Indication:  Inherited Homozygous Hypercholesterolemia   divalproex 500 MG 24 hr tablet Commonly known as:  DEPAKOTE ER Take 2 tablets (1,000 mg total) by mouth at bedtime.  Indication:  mood stabilization   gabapentin 300 MG capsule Commonly known as:  NEURONTIN Take 2 capsules (600 mg total) by mouth 4 (four) times daily.  Indication:  Aggressive Behavior, Agitation   hydrOXYzine  50 MG tablet Commonly known as:  ATARAX/VISTARIL Take 1 tablet (50 mg total) by mouth every 6 (six) hours as needed for anxiety (Sleep).  Indication:  Anxiety Neurosis, Anxiety   metFORMIN 500 MG tablet Commonly known as:  GLUCOPHAGE Take 1 tablet (500 mg total) by mouth 2 (two) times daily with a meal.  Indication:  Type 2 Diabetes   nicotine polacrilex 2 MG gum Commonly known as:  NICORETTE Take 1 each (2 mg total) by mouth as needed for smoking cessation.  Indication:  Nicotine Addiction   pantoprazole 20 MG tablet Commonly known as:  PROTONIX Take 1 tablet (20 mg total) by mouth 2 (two) times daily.  Indication:  Gastroesophageal Reflux Disease   propranolol 10 MG tablet Commonly known  as:  INDERAL Take 1 tablet (10 mg total) by mouth 3 (three) times daily.  Indication:  Anxiety   QUEtiapine 300 MG tablet Commonly known as:  SEROQUEL Take 1 tablet (300 mg total) by mouth at bedtime.  Indication:  mood stabilization   QUEtiapine 50 MG tablet Commonly known as:  SEROQUEL Take 1 tablet (50 mg total) by mouth 2 (two) times daily.  Indication:  mood stabilization   traZODone 50 MG tablet Commonly known as:  DESYREL Take 1 tablet (50 mg total) by mouth at bedtime as needed for sleep. What changed:  medication strength  how much to take  when to take this  reasons to take this  Indication:  Trouble Sleeping      Follow-up Information    [facility] Follow up on 08/28/2016.   Why:  Tuesday at 1:45 for your hospital follow up appointment Contact information: [address] [place] [address] [phone]           Follow-up recommendations:  Activity:  as tol Diet:  as tol  Comments:  1.  Take all your medications as prescribed.   2.  Report any adverse side effects to outpatient provider. 3.  Patient instructed to not use alcohol or illegal drugs while on prescription medicines. 4.  In the event of worsening symptoms, instructed patient to call 911, the crisis hotline or go to nearest emergency room for evaluation of symptoms.  Signed: Lindwood Qua, NP [facility] 08/27/2016, 11:43 AM

## 2016-08-27 NOTE — Progress Notes (Signed)
  [facility] :  Will you be returning to the same living situation after discharge:  Yes,  home At discharge, do you have transportation home?: Yes,  friend Do you have the ability to pay for your medications: Yes,  mental health  Release of information consent forms completed and in the chart;  Patient's signature needed at discharge.  Patient to Follow up at: Follow-up Information    [facility] Follow up on 08/28/2016.   Why:  Tuesday at 1:45 for your hospital follow up appointment Contact information: [address] [place] [address] [phone]           Next level of care provider [facility] Link:no  Safety Planning and Suicide Prevention discussed: Yes,  yes  Have you used any form of tobacco in the last 30 days? (Cigarettes, Smokeless Tobacco, Cigars, and/or Pipes): Yes  Has patient been referred to the Quitline?: Patient refused referral  Patient has been referred for addiction treatment: Pt. refused referral  Mitchell RogueRodney B Daysie George 08/27/2016, 10:37 AM

## 2016-11-23 NOTE — Progress Notes (Signed)
Put patients requested medical records to be mailed out today to [employer] per their request. I also electronically faxed them the requested records from when the patient saw Dr. Irving ShowsEgerton as well.

## 2022-03-11 DIAGNOSIS — Z9289 Personal history of other medical treatment: Secondary | ICD-10-CM

## 2022-03-11 HISTORY — DX: Personal history of other medical treatment: Z92.89

## 2022-06-25 ENCOUNTER — Encounter (HOSPITAL_BASED_OUTPATIENT_CLINIC_OR_DEPARTMENT_OTHER): Payer: Medicaid Other | Attending: General Surgery | Admitting: General Surgery

## 2024-02-22 DIAGNOSIS — I639 Cerebral infarction, unspecified: Secondary | ICD-10-CM

## 2024-02-22 HISTORY — DX: Cerebral infarction, unspecified: I63.9

## 2024-04-20 DIAGNOSIS — I219 Acute myocardial infarction, unspecified: Secondary | ICD-10-CM

## 2024-04-20 HISTORY — DX: Acute myocardial infarction, unspecified: I21.9

## 2024-11-19 DIAGNOSIS — I499 Cardiac arrhythmia, unspecified: Secondary | ICD-10-CM

## 2024-11-19 HISTORY — DX: Cardiac arrhythmia, unspecified: I49.9

## 2024-11-30 NOTE — H&P (Addendum)
 Date: September 30, 2024   Patient: Mitchell George  PID: [id number]  DOB: 1975-12-26  SEX: Male   Patient referred by DDS for extraction all remaining teeth  CC: Bad teeth.  Past Medical History:  Heart Attack, Stroke, Blood transfusion, Diabetes, Arthritis, Stomach Ulcers, Mental Health problems, Snoring, Artificial Joints, Acid Reflux, Chronic Fatigue Syndrome    Medications: Metformin , Gabapentin , Eliquis, Cyclobenzaprine, Olanzapine , Methocarbamol , Lipitor, Fluocinonide, Amiodarone, Magnesium , Metoprolol, Clopidogrel, Iron Pill, Naproxen , Lasix, Insta-glucose, Amoxicillin , Pantoprazole , Oxycodone , Vitamin D2, Ezetimibe, Loratadine, Metformin  HCL, Trazodone , Atorvastatin , Famotidine, Metoprolol, Furosemide    Allergies:     Aspirin, Latex, Tylenol     Surgeries:   Stent placed, Amputation     Social History       Smoking:            Alcohol: Drug use:                             Exam: BMI 27. Multiple caries all remaining teeth. Mobile teeth #  11, 12.  No purulence, edema, fluctuance, trismus. Oral cancer screening negative. Pharynx clear. No lymphadenopathy.  Panorex:Multiple caries, bone loss upper and lower.  Assessment: ASA 3. Non-restorable  teeth # 3, 11, 12, 14, 15, 18, 19, 20, 21, 22, 23, 24, 25, 26, 27, 28, 29, 30, 31.              Plan: 1. MD Clearance 2. Extraction Teeth #  3, 11, 12, 14, 15, 18, 19, 20, 21, 22, 23, 24, 25, 26, 27, 28, 29, 30, 31. Alveoloplasty.             Hospital Day surgery.                 Rx: n               Risks and complications explained. Questions answered.   Glendia EMERSON Primrose, DMD

## 2024-12-02 ENCOUNTER — Other Ambulatory Visit: Payer: Self-pay

## 2024-12-02 ENCOUNTER — Encounter (HOSPITAL_COMMUNITY): Payer: Self-pay | Admitting: Oral Surgery

## 2024-12-02 NOTE — Progress Notes (Signed)
 PCP - Dr Elspeth Organ at [facility] Cardiologist - Dr Cecelia Broody  Chest x-ray - 07/13/24 [facility] EKG - 11/25/24 [facility] - Requested Tracing Stress Test - n/a ECHO - 07/15/24 [facility] Cardiac Cath - 04/20/24 [facility]  ICD Pacemaker/Loop - n/a  Sleep Study -  n/a  Diabetes Type 2 Dexcom G7 Systerm, Sensor on left arm  Hold Ozempic 7 days prior to procedure.  Patient has not taken this med in past 2 months.  Take 17 units of Lantus on Thursday night.    Do not take Metformin  on the morning of surgery.  Eliquis and Plavix - last dose was on 11/29/24 per pt.   Aspirin Instructions: n/a  NPO  Anesthesia review: Yes  STOP now taking any Aspirin (unless otherwise instructed by your surgeon), Aleve , Naproxen , Ibuprofen , Motrin , Advil , Goody's, BC's, all herbal medications, fish oil, and all vitamins.   Coronavirus Screening Do you have any of the following symptoms:  Cough yes/no: No Fever (>100.72F)  yes/no: No Runny nose yes/no: No Sore throat yes/no: No Difficulty breathing/shortness of breath  yes/no: No  Have you traveled in the last 14 days and where? yes/no: No  Patient verbalized understanding of instructions that were given via phone.

## 2024-12-04 ENCOUNTER — Encounter (HOSPITAL_COMMUNITY): Payer: Self-pay | Admitting: Oral Surgery

## 2024-12-04 ENCOUNTER — Other Ambulatory Visit: Payer: Self-pay

## 2024-12-04 ENCOUNTER — Emergency Department (HOSPITAL_COMMUNITY)
Admission: EM | Admit: 2024-12-04 | Discharge: 2024-12-04 | Disposition: A | Discharge status: Home / Self Care | Payer: MEDICAID | Attending: Emergency Medicine | Admitting: Emergency Medicine

## 2024-12-04 ENCOUNTER — Ambulatory Visit (HOSPITAL_COMMUNITY): Payer: MEDICAID | Admitting: Anesthesiology

## 2024-12-04 ENCOUNTER — Encounter (HOSPITAL_COMMUNITY): Admission: RE | Disposition: A | Payer: Self-pay | Source: Home / Self Care | Attending: Oral Surgery

## 2024-12-04 ENCOUNTER — Ambulatory Visit (HOSPITAL_COMMUNITY)
Admission: RE | Admit: 2024-12-04 | Discharge: 2024-12-04 | Disposition: A | Discharge status: Home / Self Care | Payer: MEDICAID | Attending: Oral Surgery | Admitting: Oral Surgery

## 2024-12-04 ENCOUNTER — Encounter (HOSPITAL_COMMUNITY): Payer: Self-pay

## 2024-12-04 DIAGNOSIS — I252 Old myocardial infarction: Secondary | ICD-10-CM | POA: Insufficient documentation

## 2024-12-04 DIAGNOSIS — I2510 Atherosclerotic heart disease of native coronary artery without angina pectoris: Secondary | ICD-10-CM | POA: Insufficient documentation

## 2024-12-04 DIAGNOSIS — M797 Fibromyalgia: Secondary | ICD-10-CM | POA: Insufficient documentation

## 2024-12-04 DIAGNOSIS — K0889 Other specified disorders of teeth and supporting structures: Secondary | ICD-10-CM | POA: Diagnosis not present

## 2024-12-04 DIAGNOSIS — Z79899 Other long term (current) drug therapy: Secondary | ICD-10-CM | POA: Diagnosis not present

## 2024-12-04 DIAGNOSIS — E119 Type 2 diabetes mellitus without complications: Secondary | ICD-10-CM | POA: Insufficient documentation

## 2024-12-04 DIAGNOSIS — Z7985 Long-term (current) use of injectable non-insulin antidiabetic drugs: Secondary | ICD-10-CM | POA: Insufficient documentation

## 2024-12-04 DIAGNOSIS — I10 Essential (primary) hypertension: Secondary | ICD-10-CM | POA: Diagnosis not present

## 2024-12-04 DIAGNOSIS — G8918 Other acute postprocedural pain: Secondary | ICD-10-CM | POA: Insufficient documentation

## 2024-12-04 DIAGNOSIS — K219 Gastro-esophageal reflux disease without esophagitis: Secondary | ICD-10-CM | POA: Insufficient documentation

## 2024-12-04 DIAGNOSIS — K029 Dental caries, unspecified: Secondary | ICD-10-CM | POA: Insufficient documentation

## 2024-12-04 DIAGNOSIS — K91840 Postprocedural hemorrhage and hematoma of a digestive system organ or structure following a digestive system procedure: Secondary | ICD-10-CM | POA: Insufficient documentation

## 2024-12-04 DIAGNOSIS — Z794 Long term (current) use of insulin: Secondary | ICD-10-CM | POA: Insufficient documentation

## 2024-12-04 DIAGNOSIS — Z8673 Personal history of transient ischemic attack (TIA), and cerebral infarction without residual deficits: Secondary | ICD-10-CM | POA: Diagnosis not present

## 2024-12-04 DIAGNOSIS — F419 Anxiety disorder, unspecified: Secondary | ICD-10-CM | POA: Diagnosis not present

## 2024-12-04 DIAGNOSIS — Z7901 Long term (current) use of anticoagulants: Secondary | ICD-10-CM | POA: Insufficient documentation

## 2024-12-04 DIAGNOSIS — Z955 Presence of coronary angioplasty implant and graft: Secondary | ICD-10-CM | POA: Insufficient documentation

## 2024-12-04 DIAGNOSIS — Z91040 Latex allergy status: Secondary | ICD-10-CM | POA: Insufficient documentation

## 2024-12-04 DIAGNOSIS — Z7984 Long term (current) use of oral hypoglycemic drugs: Secondary | ICD-10-CM | POA: Diagnosis not present

## 2024-12-04 DIAGNOSIS — Z87891 Personal history of nicotine dependence: Secondary | ICD-10-CM | POA: Diagnosis not present

## 2024-12-04 DIAGNOSIS — G709 Myoneural disorder, unspecified: Secondary | ICD-10-CM | POA: Insufficient documentation

## 2024-12-04 DIAGNOSIS — K056 Periodontal disease, unspecified: Secondary | ICD-10-CM | POA: Diagnosis present

## 2024-12-04 DIAGNOSIS — I4891 Unspecified atrial fibrillation: Secondary | ICD-10-CM | POA: Insufficient documentation

## 2024-12-04 DIAGNOSIS — Z7902 Long term (current) use of antithrombotics/antiplatelets: Secondary | ICD-10-CM | POA: Insufficient documentation

## 2024-12-04 DIAGNOSIS — F319 Bipolar disorder, unspecified: Secondary | ICD-10-CM | POA: Insufficient documentation

## 2024-12-04 DIAGNOSIS — F4310 Post-traumatic stress disorder, unspecified: Secondary | ICD-10-CM | POA: Diagnosis not present

## 2024-12-04 DIAGNOSIS — K1379 Other lesions of oral mucosa: Secondary | ICD-10-CM

## 2024-12-04 HISTORY — PX: TOOTH EXTRACTION: SHX859

## 2024-12-04 HISTORY — DX: Other seasonal allergic rhinitis: J30.2

## 2024-12-04 HISTORY — DX: Attention-deficit hyperactivity disorder, unspecified type: F90.9

## 2024-12-04 HISTORY — DX: Gastro-esophageal reflux disease without esophagitis: K21.9

## 2024-12-04 HISTORY — DX: Atherosclerotic heart disease of native coronary artery without angina pectoris: I25.10

## 2024-12-04 HISTORY — DX: Unspecified osteoarthritis, unspecified site: M19.90

## 2024-12-04 HISTORY — DX: Hyperlipidemia, unspecified: E78.5

## 2024-12-04 HISTORY — DX: Depression, unspecified: F32.A

## 2024-12-04 HISTORY — DX: Pneumonia, unspecified organism: J18.9

## 2024-12-04 HISTORY — DX: Sleep disorder, unspecified: G47.9

## 2024-12-04 HISTORY — DX: Essential (primary) hypertension: I10

## 2024-12-04 HISTORY — DX: Male erectile dysfunction, unspecified: N52.9

## 2024-12-04 HISTORY — DX: Personal history of urinary calculi: Z87.442

## 2024-12-04 HISTORY — DX: Myoneural disorder, unspecified: G70.9

## 2024-12-04 HISTORY — DX: Fibromyalgia: M79.7

## 2024-12-04 LAB — GLUCOSE, CAPILLARY
Glucose-Capillary: 95 mg/dL (ref 70–99)
Glucose-Capillary: 87 mg/dL (ref 70–99)
Glucose-Capillary: 125 mg/dL — ABNORMAL HIGH (ref 70–99)

## 2024-12-04 MED ORDER — LIDOCAINE-EPINEPHRINE 2 %-1:100000 IJ SOLN
INTRAMUSCULAR | Status: DC | PRN
  Administered 2024-12-04: 20 mL

## 2024-12-04 MED ORDER — SODIUM CHLORIDE 0.9 % IR SOLN
Status: DC | PRN
  Administered 2024-12-04: 1000 mL

## 2024-12-04 MED ORDER — OXYMETAZOLINE HCL 0.05 % NA SOLN
NASAL | Status: AC
  Filled 2024-12-04: qty 30

## 2024-12-04 MED ORDER — ROCURONIUM BROMIDE 10 MG/ML (PF) SYRINGE
PREFILLED_SYRINGE | INTRAVENOUS | Status: DC | PRN
  Administered 2024-12-04: 50 mg via INTRAVENOUS

## 2024-12-04 MED ORDER — SUGAMMADEX SODIUM 200 MG/2ML IV SOLN
INTRAVENOUS | Status: DC | PRN
  Administered 2024-12-04: 400 mg via INTRAVENOUS

## 2024-12-04 MED ORDER — DEXMEDETOMIDINE HCL IN NACL 80 MCG/20ML IV SOLN
INTRAVENOUS | Status: AC
  Filled 2024-12-04: qty 20

## 2024-12-04 MED ORDER — CHLORHEXIDINE GLUCONATE 0.12 % MT SOLN
15.0000 mL | Freq: Once | OROMUCOSAL | Status: AC
  Administered 2024-12-04: 15 mL via OROMUCOSAL
  Filled 2024-12-04: qty 15

## 2024-12-04 MED ORDER — OXYCODONE HCL 5 MG PO TABS: 5.0000 mg | ORAL_TABLET | ORAL | 0 refills | Status: AC | PRN

## 2024-12-04 MED ORDER — AMOXICILLIN 500 MG PO CAPS: 500.0000 mg | ORAL_CAPSULE | Freq: Three times a day (TID) | ORAL | 0 refills | Status: AC

## 2024-12-04 MED ORDER — LACTATED RINGERS IV SOLN: INTRAVENOUS | Status: DC

## 2024-12-04 MED ORDER — ORAL CARE MOUTH RINSE: 15.0000 mL | Freq: Once | OROMUCOSAL | Status: AC

## 2024-12-04 MED ORDER — PROPOFOL 10 MG/ML IV BOLUS
INTRAVENOUS | Status: AC
  Filled 2024-12-04: qty 20

## 2024-12-04 MED ORDER — PHENYLEPHRINE 80 MCG/ML (10ML) SYRINGE FOR IV PUSH (FOR BLOOD PRESSURE SUPPORT)
PREFILLED_SYRINGE | INTRAVENOUS | Status: DC | PRN
  Administered 2024-12-04: 160 ug via INTRAVENOUS
  Administered 2024-12-04: 40 ug via INTRAVENOUS
  Administered 2024-12-04: 80 ug via INTRAVENOUS
  Administered 2024-12-04: 40 ug via INTRAVENOUS
  Administered 2024-12-04: 160 ug via INTRAVENOUS

## 2024-12-04 MED ORDER — FENTANYL CITRATE (PF) 250 MCG/5ML IJ SOLN
INTRAMUSCULAR | Status: DC | PRN
  Administered 2024-12-04 (×2): 50 ug via INTRAVENOUS

## 2024-12-04 MED ORDER — GELATIN ABSORBABLE 12-7 MM EX MISC
CUTANEOUS | Status: DC | PRN
  Administered 2024-12-04: 1 via TOPICAL

## 2024-12-04 MED ORDER — OXYCODONE HCL 5 MG/5ML PO SOLN
ORAL | Status: AC
  Filled 2024-12-04: qty 5

## 2024-12-04 MED ORDER — ONDANSETRON HCL 4 MG/2ML IJ SOLN
INTRAMUSCULAR | Status: AC
  Filled 2024-12-04: qty 2

## 2024-12-04 MED ORDER — DEXMEDETOMIDINE HCL IN NACL 80 MCG/20ML IV SOLN
INTRAVENOUS | Status: DC | PRN
  Administered 2024-12-04: 8 ug via INTRAVENOUS

## 2024-12-04 MED ORDER — LIDOCAINE 2% (20 MG/ML) 5 ML SYRINGE
INTRAMUSCULAR | Status: DC | PRN
  Administered 2024-12-04: 80 mg via INTRAVENOUS

## 2024-12-04 MED ORDER — OXYCODONE HCL 5 MG/5ML PO SOLN
5.0000 mg | Freq: Once | ORAL | Status: AC | PRN
  Administered 2024-12-04: 5 mg via ORAL

## 2024-12-04 MED ORDER — STERILE WATER FOR IRRIGATION IR SOLN
Status: DC | PRN
  Administered 2024-12-04: 1000 mL

## 2024-12-04 MED ORDER — ACETAMINOPHEN 10 MG/ML IV SOLN
INTRAVENOUS | Status: AC
  Filled 2024-12-04: qty 100

## 2024-12-04 MED ORDER — MIDAZOLAM HCL (PF) 2 MG/2ML IJ SOLN
INTRAMUSCULAR | Status: DC | PRN
  Administered 2024-12-04: 2 mg via INTRAVENOUS

## 2024-12-04 MED ORDER — FENTANYL CITRATE (PF) 100 MCG/2ML IJ SOLN
INTRAMUSCULAR | Status: AC
  Filled 2024-12-04: qty 2

## 2024-12-04 MED ORDER — AMISULPRIDE (ANTIEMETIC) 5 MG/2ML IV SOLN: 10.0000 mg | Freq: Once | INTRAVENOUS | Status: DC | PRN

## 2024-12-04 MED ORDER — MIDAZOLAM HCL 2 MG/2ML IJ SOLN
INTRAMUSCULAR | Status: AC
  Filled 2024-12-04: qty 2

## 2024-12-04 MED ORDER — ROCURONIUM BROMIDE 10 MG/ML (PF) SYRINGE
PREFILLED_SYRINGE | INTRAVENOUS | Status: AC
  Filled 2024-12-04: qty 10

## 2024-12-04 MED ORDER — ACETAMINOPHEN 10 MG/ML IV SOLN
1000.0000 mg | Freq: Once | INTRAVENOUS | Status: DC | PRN
  Administered 2024-12-04: 1000 mg via INTRAVENOUS

## 2024-12-04 MED ORDER — LIDOCAINE-EPINEPHRINE 2 %-1:100000 IJ SOLN
INTRAMUSCULAR | Status: AC
  Filled 2024-12-04: qty 1

## 2024-12-04 MED ORDER — OXYMETAZOLINE HCL 0.05 % NA SOLN
NASAL | Status: DC | PRN
  Administered 2024-12-04: 3 via NASAL

## 2024-12-04 MED ORDER — INSULIN ASPART 100 UNIT/ML IJ SOLN: 0.0000 [IU] | INTRAMUSCULAR | Status: DC | PRN

## 2024-12-04 MED ORDER — PROPOFOL 10 MG/ML IV BOLUS
INTRAVENOUS | Status: DC | PRN
  Administered 2024-12-04: 140 mg via INTRAVENOUS

## 2024-12-04 MED ORDER — 0.9 % SODIUM CHLORIDE (POUR BTL) OPTIME
TOPICAL | Status: DC | PRN
  Administered 2024-12-04: 1000 mL

## 2024-12-04 MED ORDER — DEXAMETHASONE SOD PHOSPHATE PF 10 MG/ML IJ SOLN
INTRAMUSCULAR | Status: AC
  Filled 2024-12-04: qty 1

## 2024-12-04 MED ORDER — PHENYLEPHRINE 80 MCG/ML (10ML) SYRINGE FOR IV PUSH (FOR BLOOD PRESSURE SUPPORT)
PREFILLED_SYRINGE | INTRAVENOUS | Status: AC
  Filled 2024-12-04: qty 10

## 2024-12-04 MED ORDER — CEFAZOLIN SODIUM-DEXTROSE 2-4 GM/100ML-% IV SOLN
2.0000 g | INTRAVENOUS | Status: AC
  Administered 2024-12-04: 2 g via INTRAVENOUS
  Filled 2024-12-04: qty 100

## 2024-12-04 MED ORDER — LIDOCAINE HCL 2 % IJ SOLN
INTRAMUSCULAR | Status: AC
  Filled 2024-12-04: qty 20

## 2024-12-04 MED ORDER — TRANEXAMIC ACID FOR EPISTAXIS
500.0000 mg | Freq: Once | TOPICAL | Status: AC
  Administered 2024-12-04: 500 mg via TOPICAL
  Filled 2024-12-04 (×2): qty 10

## 2024-12-04 MED ORDER — OXYCODONE HCL 5 MG PO TABS: 5.0000 mg | ORAL_TABLET | Freq: Once | ORAL | Status: AC | PRN

## 2024-12-04 MED ORDER — FENTANYL CITRATE (PF) 100 MCG/2ML IJ SOLN
25.0000 ug | INTRAMUSCULAR | Status: DC | PRN
  Administered 2024-12-04 (×2): 25 ug via INTRAVENOUS
  Administered 2024-12-04 (×2): 50 ug via INTRAVENOUS

## 2024-12-04 MED ORDER — PHENYLEPHRINE HCL-NACL 20-0.9 MG/250ML-% IV SOLN
INTRAVENOUS | Status: DC | PRN
  Administered 2024-12-04: 25 ug/min via INTRAVENOUS

## 2024-12-04 MED ORDER — LIDOCAINE 2% (20 MG/ML) 5 ML SYRINGE
INTRAMUSCULAR | Status: AC
  Filled 2024-12-04: qty 5

## 2024-12-04 MED ORDER — DEXAMETHASONE SOD PHOSPHATE PF 10 MG/ML IJ SOLN
INTRAMUSCULAR | Status: DC | PRN
  Administered 2024-12-04: 10 mg via INTRAVENOUS

## 2024-12-04 MED ORDER — ONDANSETRON HCL 4 MG/2ML IJ SOLN
INTRAMUSCULAR | Status: DC | PRN
  Administered 2024-12-04: 4 mg via INTRAVENOUS

## 2024-12-04 NOTE — Transfer of Care (Signed)
 Immediate Anesthesia Transfer of Care Note  Patient: Mitchell George  Procedure(s) Performed: EXTRACTION TEETH NUMBER THREE, ELEVEN, TWELEVE, FOURTEEN, FIFTEEN, EIGHTEEN, NINETEEN, TWENTY, TWENTY ONE, TWENTY TWO, TWENTY THREE, TWENTY FOUR, TWENTY FIVE, TWENTY SIX, TWENTY SEVEN, TWENTY EIGHT, TWENTY NINE, THIRTY, THIRTY ONE AND ALVEOLOPLASTY (Mouth)  Patient Location: PACU  Anesthesia Type:General  Level of Consciousness: sedated and responds to stimulation  Airway & Oxygen Therapy: Patient Spontanous Breathing and Patient connected to face mask oxygen  Post-op Assessment: Report given to RN and Post -op Vital signs reviewed and stable  Post vital signs: Reviewed and stable  Last Vitals:  Vitals Value Taken Time  BP    Temp    Pulse 77 12/04/24 10:20  Resp 12 12/04/24 10:20  SpO2 100 % 12/04/24 10:20  Vitals shown include unfiled device data.  Last Pain:  Vitals:   12/04/24 0643  TempSrc:   PainSc: 0-No pain         Complications: There were no known notable events for this encounter.

## 2024-12-04 NOTE — Anesthesia Procedure Notes (Signed)
 Procedure Name: Intubation Date/Time: 12/04/2024 9:06 AM  Performed by: Kearney Rosina SAILOR, RNPre-anesthesia Checklist: Patient identified, Emergency Drugs available, Suction available and Patient being monitored Patient Re-evaluated:Patient Re-evaluated prior to induction Oxygen Delivery Method: Circle System Utilized Preoxygenation: Pre-oxygenation with 100% oxygen Induction Type: IV induction Ventilation: Mask ventilation without difficulty and Nasal airway inserted- appropriate to patient size Laryngoscope Size: Glidescope and 4 Grade View: Grade I Nasal Tubes: Nasal Rae and Nasal prep performed Tube size: 7.5 mm Number of attempts: 1 Placement Confirmation: ETT inserted through vocal cords under direct vision, positive ETCO2 and breath sounds checked- equal and bilateral Tube secured with: Tape Dental Injury: Teeth and Oropharynx as per pre-operative assessment  Comments: Atraumatic placement.

## 2024-12-04 NOTE — ED Triage Notes (Signed)
 Notied Dr Glendia Simons and charge RN of pts arrival.

## 2024-12-04 NOTE — Op Note (Signed)
 NAME: Mitchell George, Mitchell George MEDICAL RECORD NO: [id number] ACCOUNT NO: [id number] DATE OF BIRTH: 1976/02/01 FACILITY: [facility] LOCATION: [facility] PHYSICIAN: Glendia EMERSON Primrose, DDS  Operative Report   DATE OF PROCEDURE: 12/04/2024  PREOPERATIVE DIAGNOSIS:  Non-restorable teeth numbers 3, 11, 12, 14, 15, 18, 19, 20, 21, 22, 23, 24, 25, 26, 27, 28, 29, 30, 31 secondary to dental caries and periodontal disease.  POSTOPERATIVE DIAGNOSIS:  Non-restorable teeth numbers 3, 11, 12, 14, 15, 18, 19, 20, 21, 22, 23, 24, 25, 26, 27, 28, 29, 30, 31 secondary to dental caries and periodontal disease.  PROCEDURE: 1.  Extraction of teeth numbers 3, 11, 12, 14, 15, 18, 19, 20, 21, 22, 23, 24, 25, 26, 27, 28, 29, 30, 31. 2.  Alveoloplasty, left maxilla, right and left mandible.  SURGEON:  Glendia EMERSON Primrose, DDS  ANESTHESIA:  General nasal intubation, Dr. Patrisha attending.  DESCRIPTION OF PROCEDURE:  The patient was taken to the operating room and placed on the table in a supine position.  A nasal endotracheal tube was placed and secured.  The eyes were protected.  The patient was draped for surgery.  A timeout was  performed.  The posterior pharynx was suctioned, and a throat pack was placed.  2% lidocaine  1:100,000 epinephrine  was infiltrated in an inferior alveolar block bilaterally in the mandible and with buccal infiltration of the anterior mandible and then  buccal and palatal infiltration in the maxilla around the teeth to be removed.  A bite block was placed in the right side of the mouth.  The left side was operated first.  A 15 blade was used to make an incision around tooth numbers 18, 19, 20, 21, 22,  23, 24, 25, 26 in the buccal sulcus and again in the lingual sulcus going forward towards the midline.  The periosteum was reflected from around these teeth.  The teeth were elevated with a 301 elevator and removed with the dental forceps.  The sockets  were curetted.  The periosteum was reflected to expose the  alveolar crest, which was irregular in contour owing to periodontal disease.  The alveoloplasty was performed using the egg burr and the Stryker handpiece under irrigation followed by the bone  file for further smoothing of the bone.  Then the area was irrigated and closed with 3-0 chromic.  Then the left maxilla was operated.  A 15 blade was used to make an incision around teeth numbers 14 and 15 in the buccal and palatal sulcus and carried forward to teeth numbers 12 and 11 in the buccal and palatal sulcus.  The periosteum was reflected.   The teeth were elevated and removed with the dental forceps.  The sockets were curetted.  The tissue was reflected to expose the alveolar crest, which was irregular in contour.  Alveoloplasty was then performed using the egg burr followed by the bone  file.  Then this area was irrigated and closed with 3-0 chromic.  Attention was turned to the right side.  The 15 blade was used to make an incision around 27, 28, 29, 30, and 31 in the buccal and lingual sulcus.  The periosteum was reflected.  The teeth  were elevated with a 301 elevator and removed from the mouth with the forceps.  The tissue was debrided and trimmed, and then the tissue was reflected to expose the alveolar crest, which had an irregular contour.  The alveoloplasty was performed using  the egg burr followed by the bone file, and the area  was closed with 3-0 chromic.  Then tooth #3 was removed using a 15 blade to make an incision in the gingival sulcus around the tooth.  The tooth was easily elevated and then was removed from the mouth with the dental forceps.  The socket was curetted, irrigated, and closed with 3-0  chromic.  Then the oral cavity was irrigated and suctioned.  The throat pack was removed.  The patient was left in the care of anesthesia for extubation and transport to recovery with plans for discharge home through day surgery.  ESTIMATED BLOOD LOSS:  Minimal.  COMPLICATIONS:   None.  SPECIMENS:  None.  COUNTS:  Correct.   PUS D: 12/04/2024 10:10:41 am T: 12/04/2024 10:21:00 am  JOB: [id number]/ [id number]

## 2024-12-04 NOTE — ED Notes (Signed)
 Assisted PT with requesting meds for pain and mouth care.

## 2024-12-04 NOTE — ED Triage Notes (Signed)
 Pt POV with wife d/t post op oral surgery at 20 with Dr Romelia. Pt states he has someof he gum hanging down from mouth.  Pt had all of his teeth removed.

## 2024-12-04 NOTE — ED Provider Notes (Signed)
 " [place] [facility] AT [facility] Provider Note   CSN: [id number] Arrival date & time: 12/04/24  1846     Patient presents with: Post-op Problem   Mitchell George is a 49 y.o. male.   49 year old male history of CAD on Plavix and A-fib on Eliquis, and cavities who presented to the emergency department after having all of his teeth extracted this morning at 8:45 AM with oral surgery.  [place] that he is having pain and has a large clot from his left maxillary region.       Prior to Admission medications  Medication Sig Start Date End Date Taking? Authorizing Provider  amoxicillin  (AMOXIL ) 500 MG capsule Take 1 capsule (500 mg total) by mouth 3 (three) times daily. 12/04/24   Sheryle Hamilton, DMD  atorvastatin  (LIPITOR) 40 MG tablet Take 1 tablet (40 mg total) by mouth daily at 6 PM. Patient taking differently: Take 80 mg by mouth daily at 6 PM. 08/27/16   Secundino Cones, NP  bumetanide (BUMEX) 2 MG tablet Take 2 mg by mouth 2 (two) times daily. 11/25/24   [provider]  clopidogrel (PLAVIX) 75 MG tablet Take 75 mg by mouth daily. 07/27/22   [provider]  ELIQUIS 5 MG TABS tablet Take 5 mg by mouth 2 (two) times daily.    [provider]  ezetimibe (ZETIA) 10 MG tablet Take 10 mg by mouth daily.    [provider]  famotidine (PEPCID) 20 MG tablet Take 20 mg by mouth daily. 08/08/24   [provider]  furosemide (LASIX) 40 MG tablet Take 40 mg by mouth daily.    [provider]  gabapentin  (NEURONTIN ) 300 MG capsule Take 2 capsules (600 mg total) by mouth 4 (four) times daily. Patient taking differently: Take 600 mg by mouth 3 (three) times daily. 08/27/16   Secundino Cones, NP  insulin  glargine (LANTUS SOLOSTAR) 100 UNIT/ML Solostar Pen Inject 34 Units into the skin at bedtime. 02/25/24   [provider]  loratadine (CLARITIN) 10 MG tablet Take 10 mg by mouth daily. 02/20/24   [provider]   magnesium  oxide (MAG-OX) 400 MG tablet Take 400 mg by mouth daily. 07/27/22   [provider]  metFORMIN  (GLUCOPHAGE ) 500 MG tablet Take 1 tablet (500 mg total) by mouth 2 (two) times daily with a meal. 08/27/16   Secundino Cones, NP  methocarbamol  (ROBAXIN ) 500 MG tablet Take 1 tablet by mouth 2 (two) times daily. 10/21/24   [provider]  metoprolol tartrate (LOPRESSOR) 25 MG tablet Take 25 mg by mouth 2 (two) times daily. 06/07/23   [provider]  nitroGLYCERIN (NITROSTAT) 0.4 MG SL tablet Place 0.4 mg under the tongue every 5 (five) minutes as needed for chest pain. 05/15/24   [provider]  oxyCODONE  (OXY IR/ROXICODONE ) 5 MG immediate release tablet Take 1 tablet (5 mg total) by mouth every 4 (four) hours as needed for up to 5 days. 12/04/24 12/09/24  Sheryle Hamilton, DMD  Oxycodone  HCl 10 MG TABS Take 1 tablet by mouth every 4 (four) hours. 10/21/24   [provider]  OZEMPIC, 1 MG/DOSE, 4 MG/3ML SOPN Inject 1 mg into the skin once a week. 11/23/24   [provider]  pantoprazole  (PROTONIX ) 20 MG tablet Take 1 tablet (20 mg total) by mouth 2 (two) times daily. Patient taking differently: Take 40 mg by mouth daily. 08/27/16   Secundino Cones, NP  potassium chloride SA (KLOR-CON M) 20  MEQ tablet Take 20 mEq by mouth daily. 11/25/24   [provider]  sildenafil (VIAGRA) 50 MG tablet Take 50 mg by mouth as needed for erectile dysfunction. 01/22/24   [provider]  traZODone  (DESYREL ) 50 MG tablet Take 1 tablet (50 mg total) by mouth at bedtime as needed for sleep. Patient taking differently: Take 100 mg by mouth at bedtime. 08/27/16   Secundino Cones, NP    Allergies: Acetaminophen , Aspirin, and Latex    Review of Systems  Updated Vital Signs BP (!) 149/108   Pulse 95   Temp 97.9 F (36.6 C)   Resp 20   Ht 6' 2 (1.88 m)   Wt 103 kg   SpO2 100%   BMI 29.15 kg/m   Physical Exam Constitutional:      General: He is not  in acute distress.    Appearance: He is not ill-appearing.     Comments: Uncomfortable appearing  HENT:     Head:     Comments: Large amount of clot from left premolars and molars.  Mild amount of active bleeding. Eyes:     Extraocular Movements: Extraocular movements intact.     Conjunctiva/sclera: Conjunctivae normal.     Pupils: Pupils are equal, round, and reactive to light.  Neurological:     Mental Status: He is alert.     (all labs ordered are listed, but only abnormal results are displayed) Labs Reviewed - No data to display  EKG: None  Radiology: No results found.   Procedures   Medications Ordered in the ED  lidocaine  (XYLOCAINE ) 2 % (with pres) injection (  Given 12/04/24 2000)  tranexamic acid  (CYKLOKAPRON ) 1000 MG/10ML topical solution 500 mg (500 mg Topical Given 12/04/24 1959)    Clinical Course as of 12/04/24 2345  Fri Dec 04, 2024  [id number] Dr Sheryle from oral surgery at the bedside [RP]    Clinical Course User Index [RP] Yolande Lamar BROCKS, MD                                 Medical Decision Making  49 year old male history of CAD on Plavix, A-fib on Eliquis, and poor dentition who presents to the emergency department with mouth pain and bleeding after having tooth extraction today as an outpatient  Initial Ddx:  Bleeding, anemia, infection, dental pain  MDM/Course:  Patient presents after having teeth removed as an outpatient.  Has bleeding and clots.  Oral surgery was already at the bedside when I evaluated the patient.  They are evacuating the clot and attempting to achieve hemostasis.  Upon re-evaluation was still having some mild bleeding after their interventions.  Was given TXA soaked gauze.  He reports that he was instructed to hold his anticoagulation.  He is going to follow-up with them in clinic shortly  This patient presents to the ED for concern of complaints listed in HPI, this involves an extensive number of treatment options, and is a  complaint that carries with it a high risk of complications and morbidity. Disposition including potential need for admission considered.   Dispo: DC Home. Return precautions discussed including, but not limited to, those listed in the AVS. Allowed pt time to ask questions which were answered fully prior to dc.  I have reviewed the patients home medications and made adjustments as needed Additional history obtained from spouse Records reviewed Outpatient Clinic Notes Consults: oral surgery  Portions of this  note were generated with Scientist, clinical (histocompatibility and immunogenetics). Dictation errors may occur despite best attempts at proofreading.     Final diagnoses:  Post-operative pain  Mouth bleeding    ED Discharge Orders     None          Yolande Lamar BROCKS, MD 12/04/24 2345  "

## 2024-12-04 NOTE — Op Note (Signed)
 12/04/2024  10:06 AM  PATIENT:  Mitchell George  49 y.o. male  PRE-OPERATIVE DIAGNOSIS:  Nonrestorable TEETH NUMBER THREE, ELEVEN, TWELEVE, FOURTEEN, FIFTEEN, EIGHTEEN, NINETEEN, TWENTY, TWENTY ONE, TWENTY TWO, TWENTY THREE, TWENTY FOUR, TWENTY FIVE, TWENTY SIX, TWENTY SEVEN, TWENTY EIGHT, TWENTY NINE, THIRTY, THIRTY ONE   POST-OPERATIVE DIAGNOSIS:  SAME  PROCEDURE:  Procedures: EXTRACTION TEETH NUMBER THREE, ELEVEN, TWELEVE, FOURTEEN, FIFTEEN, EIGHTEEN, NINETEEN, TWENTY, TWENTY ONE, TWENTY TWO, TWENTY THREE, TWENTY FOUR, TWENTY FIVE, TWENTY SIX, TWENTY SEVEN, TWENTY EIGHT, TWENTY NINE, THIRTY, THIRTY ONE AND ALVEOLOPLASTY  SURGEON:  Surgeon(s): Sheryle Hamilton, DMD  ANESTHESIA:   local and general  EBL:  minimal  DRAINS: none   SPECIMEN:  No Specimen  COUNTS:  YES  PLAN OF CARE: Discharge to home after PACU  PATIENT DISPOSITION:  PACU - hemodynamically stable.   PROCEDURE DETAILS: Dictation #[id number]  Hamilton EMERSON Sheryle, DMD 12/04/2024 10:06 AM

## 2024-12-04 NOTE — H&P (Signed)
 H&P documentation  -History and Physical Reviewed  -Patient has been re-examined  -No change in the plan of care  Mitchell George

## 2024-12-04 NOTE — Consult Note (Signed)
 "  Mitchell George is an 49 y.o. male who underwent full mouth extraction of remaining teeth this morning in OR at [facility]. Was paged by patent at 5:30 pm c/o gums hanging down throat on upper left side. Recommended patient  to ER for evaluation and treatment    Past Medical History:  Diagnosis Date   ADHD (attention deficit hyperactivity disorder)    per pt - no meds   Arthritis    Bipolar 1 disorder (HCC)    Coronary artery disease    Depression    Diabetes mellitus    Dexcom G7 System   Dysrhythmia 11/2024   a-fib   ED (erectile dysfunction)    Fibromyalgia    GERD (gastroesophageal reflux disease)    History of blood transfusion 03/11/2022   in [facility]   History of kidney stones    passed stones   HLD (hyperlipidemia)    Hypertension    Myocardial infarction (HCC) 04/20/2024   x 2;   Neuromuscular disorder (HCC)    Pneumonia    PTSD (post-traumatic stress disorder)    Schizophrenia (HCC)    Seasonal allergies    Stroke (HCC) 02/22/2024   Hx 2 stokes in 2023, 1 mini stroke 2005   Trouble in sleeping     Past Surgical History:  Procedure Laterality Date   HAND SURGERY Left    middle finger   KNEE SURGERY Left 1998   total knee replacement   left foot surgery     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 No pertinent family history.  Social History:  reports that he has quit smoking. His smoking use included cigarettes. He has never used smokeless tobacco. He reports that he does not currently use alcohol. He reports that he does not currently use drugs after having used the following drugs: Marijuana.  Allergies: Allergies[1]  Medications: I have reviewed the patient's current medications.  Results for orders placed or performed during the hospital encounter of 12/04/24 (from the past 48 hours)  Glucose, capillary     Status: None   Collection Time: 12/04/24  6:06 AM  Result Value Ref Range    Glucose-Capillary 95 70 - 99 mg/dL    Comment: Glucose reference range applies only to samples taken after fasting for at least 8 hours.   Comment 1 Notify RN   Glucose, capillary     Status: None   Collection Time: 12/04/24  8:04 AM  Result Value Ref Range   Glucose-Capillary 87 70 - 99 mg/dL    Comment: Glucose reference range applies only to samples taken after fasting for at least 8 hours.  Glucose, capillary     Status: Abnormal   Collection Time: 12/04/24 10:19 AM  Result Value Ref Range   Glucose-Capillary 125 (H) 70 - 99 mg/dL    Comment: Glucose reference range applies only to samples taken after fasting for at least 8 hours.   Comment 1 Notify RN     No results found.  ROS Blood pressure 139/78, pulse 78, temperature 97.6 F (36.4 C), resp. rate 15, height 6' 2 (1.88 m), weight 103 kg, SpO2 96%. General appearance: alert, cooperative, and moderate distress Head: Normocephalic, without obvious abnormality, atraumatic Eyes: negative Throat: 3 cm liver clot left maxilla premolar region, tender to touch. No active bleeding.Oral sutures intact, pharynx clear.  Neck: no adenopathy and supple, symmetrical, trachea midline  Assessment/Plan:  Liver clot left maxilla. Remove clot with local anesthesia bedside.  Glendia Primrose 12/04/2024, 7:30 PM        [1]  Allergies Allergen Reactions   Acetaminophen  Nausea Only and Other (See Comments)    Tried to OD years back and they afraid it will poison his liver so he was told not to take it.   Aspirin Anxiety and Other (See Comments)    Panic attack   Latex     Unknown  Skin peels off and becomes sensitive   "

## 2024-12-04 NOTE — Anesthesia Preprocedure Evaluation (Addendum)
"                                    Anesthesia Evaluation  Patient identified by MRN, date of birth, ID band Patient awake    Reviewed: Allergy & Precautions, NPO status , Patient's Chart, lab work & pertinent test results  Airway Mallampati: II  TM Distance: >3 FB Neck ROM: Full    Dental  (+) Poor Dentition, Missing   Pulmonary former smoker   Pulmonary exam normal        Cardiovascular hypertension, Pt. on home beta blockers + CAD, + Past MI and + Cardiac Stents (x 5)  Normal cardiovascular exam     Neuro/Psych  PSYCHIATRIC DISORDERS Anxiety Depression Bipolar Disorder Schizophrenia  PTSD (post-traumatic stress disorder)  Neuromuscular disease CVA    GI/Hepatic ,GERD  Medicated and Controlled,,(+)     substance abuse    Endo/Other  diabetes, Insulin  Dependent, Oral Hypoglycemic Agents  Patient on GLP-1 Agonist  Renal/GU negative Renal ROS     Musculoskeletal  (+)  Fibromyalgia -, narcotic dependentAmbulates with prosthesis and a cane   Abdominal   Peds  Hematology  (+) Blood dyscrasia (Eliquis)   Anesthesia Other Findings Non-restorable  Reproductive/Obstetrics                              Anesthesia Physical Anesthesia Plan  ASA: 3  Anesthesia Plan: General   Post-op Pain Management:    Induction: Intravenous  PONV Risk Score and Plan: 2 and Ondansetron , Dexamethasone , Midazolam  and Treatment may vary due to age or medical condition  Airway Management Planned: Nasal ETT  Additional Equipment:   Intra-op Plan:   Post-operative Plan: Extubation in OR  Informed Consent: I have reviewed the patients History and Physical, chart, labs and discussed the procedure including the risks, benefits and alternatives for the proposed anesthesia with the patient or authorized representative who has indicated his/her understanding and acceptance.     Dental advisory given  Plan Discussed with: CRNA  Anesthesia Plan  Comments:         Anesthesia Quick Evaluation  "

## 2024-12-04 NOTE — Discharge Instructions (Signed)
 Return if the bleeding does not improve.  Continue to hold pressure with gauze

## 2024-12-04 NOTE — Procedures (Signed)
 12/04/2024  7:35 PM  PATIENT:  Mitchell George  49 y.o. male  PRE-OPERATIVE DIAGNOSIS:  Liver clot left maxilla.  POST-OPERATIVE DIAGNOSIS:  SAME  PROCEDURE:  removal liver clot.  ANESTHESIA:   local: 2% Lidocaine  1:100,000 epi x 5cc   PLAN OF CARE: Discharge to home after PACU  PATIENT DISPOSITION:  Patient tolerated procedure well. Continue post op instructions from surgery this am.    PROCEDURE DETAILS: Dictation #  Glendia EMERSON Primrose, DMD 12/04/2024 7:35 PM

## 2024-12-07 ENCOUNTER — Encounter (HOSPITAL_COMMUNITY): Payer: Self-pay | Admitting: Oral Surgery

## 2024-12-07 NOTE — Anesthesia Postprocedure Evaluation (Signed)
"   Anesthesia Post Note  Patient: Mitchell George  Procedure(s) Performed: EXTRACTION TEETH NUMBER THREE, ELEVEN, TWELEVE, FOURTEEN, FIFTEEN, EIGHTEEN, NINETEEN, TWENTY, TWENTY ONE, TWENTY TWO, TWENTY THREE, TWENTY FOUR, TWENTY FIVE, TWENTY SIX, TWENTY SEVEN, TWENTY EIGHT, TWENTY NINE, THIRTY, THIRTY ONE AND ALVEOLOPLASTY (Mouth)     Patient location during evaluation: PACU Anesthesia Type: General Level of consciousness: awake Pain management: pain level controlled Vital Signs Assessment: post-procedure vital signs reviewed and stable Respiratory status: spontaneous breathing, nonlabored ventilation and respiratory function stable Cardiovascular status: blood pressure returned to baseline and stable Postop Assessment: no apparent nausea or vomiting Anesthetic complications: no   There were no known notable events for this encounter.  Last Vitals:  Vitals:   12/04/24 1100 12/04/24 1115  BP: 131/78 139/78  Pulse: 82 78  Resp: 10 15  Temp:  36.4 C  SpO2: 96% 96%    Last Pain:  Vitals:   12/04/24 1100  TempSrc:   PainSc: 7                  Broly Hatfield P [place]      "
